# Patient Record
Sex: Female | Born: 1955 | Race: Black or African American | Hispanic: No | Marital: Single | Smoking: Former smoker
Health system: Southern US, Community
[De-identification: ages and names within clinical notes are randomized; demographics above are authoritative.]

## PROBLEM LIST (undated history)

## (undated) DIAGNOSIS — I10 Essential (primary) hypertension: Secondary | ICD-10-CM

## (undated) DIAGNOSIS — I739 Peripheral vascular disease, unspecified: Secondary | ICD-10-CM

## (undated) DIAGNOSIS — I639 Cerebral infarction, unspecified: Secondary | ICD-10-CM

## (undated) DIAGNOSIS — J449 Chronic obstructive pulmonary disease, unspecified: Secondary | ICD-10-CM

## (undated) DIAGNOSIS — F32A Depression, unspecified: Secondary | ICD-10-CM

## (undated) DIAGNOSIS — F329 Major depressive disorder, single episode, unspecified: Secondary | ICD-10-CM

## (undated) HISTORY — PX: CORONARY ANGIOPLASTY WITH STENT PLACEMENT: SHX49

## (undated) HISTORY — PX: APPENDECTOMY: SHX54

## (undated) HISTORY — PX: ABDOMINAL HYSTERECTOMY: SHX81

---
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
## 2016-11-23 DIAGNOSIS — I639 Cerebral infarction, unspecified: Secondary | ICD-10-CM | POA: Insufficient documentation

## 2019-08-21 ENCOUNTER — Other Ambulatory Visit: Payer: Self-pay

## 2019-08-21 DIAGNOSIS — Z20822 Contact with and (suspected) exposure to covid-19: Secondary | ICD-10-CM

## 2019-08-23 LAB — NOVEL CORONAVIRUS, NAA: SARS-CoV-2, NAA: NOT DETECTED

## 2019-08-24 ENCOUNTER — Inpatient Hospital Stay
Admission: EM | Admit: 2019-08-24 | Discharge: 2019-08-28 | DRG: 378 | Disposition: A | Discharge status: Home / Self Care | Payer: 59 | Attending: Family Medicine | Admitting: Family Medicine

## 2019-08-24 ENCOUNTER — Encounter: Payer: Self-pay | Admitting: Emergency Medicine

## 2019-08-24 ENCOUNTER — Emergency Department: Payer: 59

## 2019-08-24 ENCOUNTER — Other Ambulatory Visit: Payer: Self-pay

## 2019-08-24 DIAGNOSIS — Z880 Allergy status to penicillin: Secondary | ICD-10-CM

## 2019-08-24 DIAGNOSIS — K922 Gastrointestinal hemorrhage, unspecified: Secondary | ICD-10-CM | POA: Diagnosis present

## 2019-08-24 DIAGNOSIS — Z20822 Contact with and (suspected) exposure to covid-19: Secondary | ICD-10-CM

## 2019-08-24 DIAGNOSIS — D62 Acute posthemorrhagic anemia: Secondary | ICD-10-CM | POA: Diagnosis present

## 2019-08-24 DIAGNOSIS — K254 Chronic or unspecified gastric ulcer with hemorrhage: Secondary | ICD-10-CM | POA: Diagnosis not present

## 2019-08-24 DIAGNOSIS — D6832 Hemorrhagic disorder due to extrinsic circulating anticoagulants: Secondary | ICD-10-CM

## 2019-08-24 DIAGNOSIS — Z90710 Acquired absence of both cervix and uterus: Secondary | ICD-10-CM

## 2019-08-24 DIAGNOSIS — F329 Major depressive disorder, single episode, unspecified: Secondary | ICD-10-CM | POA: Diagnosis present

## 2019-08-24 DIAGNOSIS — Z79899 Other long term (current) drug therapy: Secondary | ICD-10-CM

## 2019-08-24 DIAGNOSIS — T45515A Adverse effect of anticoagulants, initial encounter: Secondary | ICD-10-CM | POA: Diagnosis present

## 2019-08-24 DIAGNOSIS — J449 Chronic obstructive pulmonary disease, unspecified: Secondary | ICD-10-CM | POA: Diagnosis not present

## 2019-08-24 DIAGNOSIS — K921 Melena: Secondary | ICD-10-CM

## 2019-08-24 DIAGNOSIS — I739 Peripheral vascular disease, unspecified: Secondary | ICD-10-CM | POA: Diagnosis present

## 2019-08-24 DIAGNOSIS — I69954 Hemiplegia and hemiparesis following unspecified cerebrovascular disease affecting left non-dominant side: Secondary | ICD-10-CM

## 2019-08-24 DIAGNOSIS — K319 Disease of stomach and duodenum, unspecified: Secondary | ICD-10-CM | POA: Diagnosis present

## 2019-08-24 DIAGNOSIS — Z20828 Contact with and (suspected) exposure to other viral communicable diseases: Secondary | ICD-10-CM | POA: Diagnosis present

## 2019-08-24 DIAGNOSIS — Z87891 Personal history of nicotine dependence: Secondary | ICD-10-CM

## 2019-08-24 DIAGNOSIS — I10 Essential (primary) hypertension: Secondary | ICD-10-CM | POA: Diagnosis present

## 2019-08-24 DIAGNOSIS — Z7901 Long term (current) use of anticoagulants: Secondary | ICD-10-CM

## 2019-08-24 DIAGNOSIS — Z86718 Personal history of other venous thrombosis and embolism: Secondary | ICD-10-CM

## 2019-08-24 DIAGNOSIS — G8194 Hemiplegia, unspecified affecting left nondominant side: Secondary | ICD-10-CM | POA: Insufficient documentation

## 2019-08-24 DIAGNOSIS — K253 Acute gastric ulcer without hemorrhage or perforation: Secondary | ICD-10-CM

## 2019-08-24 DIAGNOSIS — Z7982 Long term (current) use of aspirin: Secondary | ICD-10-CM

## 2019-08-24 DIAGNOSIS — Z8673 Personal history of transient ischemic attack (TIA), and cerebral infarction without residual deficits: Secondary | ICD-10-CM

## 2019-08-24 DIAGNOSIS — K449 Diaphragmatic hernia without obstruction or gangrene: Secondary | ICD-10-CM | POA: Diagnosis present

## 2019-08-24 DIAGNOSIS — Z955 Presence of coronary angioplasty implant and graft: Secondary | ICD-10-CM

## 2019-08-24 DIAGNOSIS — R791 Abnormal coagulation profile: Secondary | ICD-10-CM | POA: Diagnosis present

## 2019-08-24 DIAGNOSIS — Z7951 Long term (current) use of inhaled steroids: Secondary | ICD-10-CM

## 2019-08-24 DIAGNOSIS — M791 Myalgia, unspecified site: Secondary | ICD-10-CM | POA: Diagnosis present

## 2019-08-24 DIAGNOSIS — K920 Hematemesis: Secondary | ICD-10-CM

## 2019-08-24 DIAGNOSIS — R531 Weakness: Secondary | ICD-10-CM

## 2019-08-24 HISTORY — DX: Cerebral infarction, unspecified: I63.9

## 2019-08-24 HISTORY — DX: Chronic obstructive pulmonary disease, unspecified: J44.9

## 2019-08-24 HISTORY — DX: Peripheral vascular disease, unspecified: I73.9

## 2019-08-24 HISTORY — DX: Essential (primary) hypertension: I10

## 2019-08-24 HISTORY — DX: Depression, unspecified: F32.A

## 2019-08-24 LAB — BASIC METABOLIC PANEL
Anion gap: 8 (ref 5–15)
BUN: 20 mg/dL (ref 8–23)
CO2: 27 mmol/L (ref 22–32)
Calcium: 8.7 mg/dL — ABNORMAL LOW (ref 8.9–10.3)
Chloride: 107 mmol/L (ref 98–111)
Creatinine, Ser: 0.67 mg/dL (ref 0.44–1.00)
GFR calc Af Amer: >60 mL/min (ref >60)
GFR calc non Af Amer: >60 mL/min (ref >60)
Glucose, Bld: 116 mg/dL — ABNORMAL HIGH (ref 70–99)
Potassium: 3.9 mmol/L (ref 3.5–5.1)
Sodium: 142 mmol/L (ref 135–145)

## 2019-08-24 LAB — HEPATIC FUNCTION PANEL
ALT: 14 U/L (ref 0–44)
AST: 15 U/L (ref 15–41)
Albumin: 3.5 g/dL (ref 3.5–5.0)
Alkaline Phosphatase: 104 U/L (ref 38–126)
Bilirubin, Direct: <0.1 mg/dL (ref 0.0–0.2)
Total Bilirubin: 0.5 mg/dL (ref 0.3–1.2)
Total Protein: 7.6 g/dL (ref 6.5–8.1)

## 2019-08-24 LAB — CBC WITH DIFFERENTIAL/PLATELET
Abs Immature Granulocytes: 0.02 10*3/uL (ref 0.00–0.07)
Basophils Absolute: 0 10*3/uL (ref 0.0–0.1)
Basophils Relative: 0 %
Eosinophils Absolute: 0.2 10*3/uL (ref 0.0–0.5)
Eosinophils Relative: 2 %
HCT: 32.2 % — ABNORMAL LOW (ref 36.0–46.0)
Hemoglobin: 10.3 g/dL — ABNORMAL LOW (ref 12.0–15.0)
Immature Granulocytes: 0 %
Lymphocytes Relative: 32 %
Lymphs Abs: 3.3 10*3/uL (ref 0.7–4.0)
MCH: 26.2 pg (ref 26.0–34.0)
MCHC: 32 g/dL (ref 30.0–36.0)
MCV: 81.9 fL (ref 80.0–100.0)
Monocytes Absolute: 0.8 10*3/uL (ref 0.1–1.0)
Monocytes Relative: 8 %
Neutro Abs: 5.8 10*3/uL (ref 1.7–7.7)
Neutrophils Relative %: 58 %
Platelets: 271 10*3/uL (ref 150–400)
RBC: 3.93 MIL/uL (ref 3.87–5.11)
RDW: 15.4 % (ref 11.5–15.5)
WBC: 10 10*3/uL (ref 4.0–10.5)
nRBC: 0 % (ref 0.0–0.2)

## 2019-08-24 LAB — PROTIME-INR
INR: 4.9 (ref 0.8–1.2)
Prothrombin Time: 45.9 seconds — ABNORMAL HIGH (ref 11.4–15.2)

## 2019-08-24 LAB — POC SARS CORONAVIRUS 2 AG: SARS Coronavirus 2 Ag: NEGATIVE

## 2019-08-24 MED ORDER — VITAMIN K1 10 MG/ML IJ SOLN
10.0000 mg | Freq: Once | INTRAVENOUS | Status: AC
  Administered 2019-08-24: 23:00:00 10 mg via INTRAVENOUS
  Filled 2019-08-24 (×2): qty 1

## 2019-08-24 MED ORDER — SODIUM CHLORIDE 0.9 % IV BOLUS
500.0000 mL | Freq: Once | INTRAVENOUS | Status: AC
  Administered 2019-08-24: 18:00:00 500 mL via INTRAVENOUS

## 2019-08-24 MED ORDER — PANTOPRAZOLE SODIUM 40 MG IV SOLR
40.0000 mg | Freq: Once | INTRAVENOUS | Status: AC
  Administered 2019-08-24: 18:00:00 40 mg via INTRAVENOUS
  Filled 2019-08-24: qty 40

## 2019-08-24 MED ORDER — PHYTONADIONE 5 MG PO TABS
2.5000 mg | ORAL_TABLET | Freq: Once | ORAL | Status: DC
  Filled 2019-08-24 (×2): qty 1

## 2019-08-24 NOTE — ED Notes (Signed)
Pharmacy made aware of missing medication by this RN. Awaiting on mediation.

## 2019-08-24 NOTE — ED Notes (Signed)
Pt took night time medication with apple sauce: Atorvastatin 40mg , gabapentin 300mg , melatonin 10mg 

## 2019-08-24 NOTE — H&P (Signed)
History and Physical  Patient Name: Tammy Wagner     ZOX:[id number]    DOB: 10/18/1955    DOA: 08/24/2019 PCP: System, Pcp Not In  Patient coming from: Family's home  Chief Complaint: Melena      HPI: Tammy Wagner is a 63 y.o. 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  Patient was in usual state of health (visiting family from [place]) until yesterday when she "coughed up blood" several times.  Then today, she had a black tarry bowel movement, and noticed blood when wiping after urinating, and so came to ER.  She has not been taking NSAIDs.  She has been eating greens over Thanksgiving and hasn't had INR checked in a while.  In the ER, she was afebrile, HR and BP normal.  RR and SpO2 normal.  Hgb 10.3 g/dL which is stable from baseline (8-10 g/dL per CareEverywhere).  INR 4.9.  Gross melena on exam.  No stools or bleeding in ER.  She was given 10 mg IV vitamin K and protonic 40 mg IV and the hospitliast service were asked to evaluate.  Of note, she has had fever, aches, malasie and has 3 household members with new COVID diagnoses.          ROS: Review of Systems  Constitutional: Positive for chills and malaise/fatigue.  Respiratory: Positive for hemoptysis.   Gastrointestinal: Positive for melena. Negative for abdominal pain, blood in stool, constipation, diarrhea, heartburn, nausea and vomiting.  Genitourinary: Positive for hematuria.  Musculoskeletal: Positive for myalgias.  All other systems reviewed and are negative.         Past Medical History:  Diagnosis Date  . COPD (chronic obstructive pulmonary disease) (HCC)   . Depression   . Hypertension   . Peripheral vascular disease (HCC)   . Stroke [facility](HCC)     Past Surgical History:  Procedure Laterality Date  . ABDOMINAL HYSTERECTOMY    . APPENDECTOMY    . CORONARY ANGIOPLASTY WITH STENT PLACEMENT      Social History: Patient  lives in [place] by herself, is visiting.  The patient walks unassisted3.  Remote former smoker.  Allergies  Allergen Reactions  . Hydrochlorothiazide     Other reaction(s): Unknown  . Hydrocodone Hives  . Latex Itching  . Penicillins Itching    "I sleep over the limit of time"    Family history: Reviewed and no family history of GI bleed.  Prior to Admission medications   Medication Sig Start Date End Date Taking? Authorizing Provider  amLODipine (NORVASC) 10 MG tablet Take 10 mg by mouth daily.   Yes [provider]  atorvastatin (LIPITOR) 40 MG tablet Take 40 mg by mouth daily.   Yes [provider]  gabapentin (NEURONTIN) 300 MG capsule Take 300 mg by mouth 2 (two) times daily.   Yes [provider]  tiotropium (SPIRIVA) 18 MCG inhalation capsule Place 18 mcg into inhaler and inhale daily.   Yes [provider]  warfarin (COUMADIN) 5 MG tablet Take 5 mg by mouth daily.   Yes [provider]       Physical Exam: BP (!) 151/88 (BP Location: Left Arm)   Pulse 75   Temp 98.1 F (36.7 C) (Oral)   Resp 16   Ht 5\' 2"  (1.575 m)   Wt 113.4 kg   SpO2 100%   BMI 45.73 kg/m  General appearance: Well-developed, obese adult  female, alert and in no acute distress.   Eyes: Anicteric, conjunctiva pink, lids and lashes normal. PERRL.    ENT: No nasal deformity, discharge, epistaxis.  Hearing normal. OP moist without lesions.   Neck: No neck masses.  Trachea midline.  No thyromegaly/tenderness. Lymph: No cervical or supraclavicular lymphadenopathy. Skin: Warm and dry.  No jaundice.  No suspicious rashes or lesions. Cardiac: RRR, nl S1-S2, no murmurs appreciated.  Capillary refill is brisk.  JVP not visible.  No LE edema.  Radial pulses 2+ and symmetric. Respiratory: Normal respiratory rate and rhythm.  CTAB without rales or wheezes. Abdomen: Abdomen soft.  No TTP or guarding. No ascites, distension, hepatosplenomegaly.   MSK: No  deformities or effusions of the large joints of the upper or lower extremities bilaterally.  No cyanosis or clubbing. Neuro: Cranial nerves normal.  Sensation intact to light touch. Speech is fluent.  Muscle strength normal.    Psych: Sensorium intact and responding to questions, attention normal.  Behavior appropriate.  Affect normal.  Judgment and insight appear normal.     Labs on Admission:  I have personally reviewed following labs and imaging studies: CBC: Recent Labs  Lab 08/24/19 1431  WBC 10.0  NEUTROABS 5.8  HGB 10.3*  HCT 32.2*  MCV 81.9  PLT 271   Basic Metabolic Panel: Recent Labs  Lab 08/24/19 1431  NA 142  K 3.9  CL 107  CO2 27  GLUCOSE 116*  BUN 20  CREATININE 0.67  CALCIUM 8.7*   GFR: Estimated Creatinine Clearance: 84.6 mL/min (by C-G formula based on SCr of 0.67 mg/dL).  Liver Function Tests: Recent Labs  Lab 08/24/19 1431  AST 15  ALT 14  ALKPHOS 104  BILITOT 0.5  PROT 7.6  ALBUMIN 3.5   Coagulation Profile: Recent Labs  Lab 08/24/19 1431  INR 4.9*     Recent Results (from the past 240 hour(s))  Novel Coronavirus, NAA ([facility])     Status: None   Collection Time: 08/21/19 10:26 AM   Specimen: Nasopharyngeal(NP) swabs in vial transport medium   NASOPHARYNGE  TESTING  Result Value Ref Range Status   SARS-CoV-2, NAA Not Detected Not Detected Final    Comment: This nucleic acid amplification test was developed and its performance characteristics determined by [employer]. Nucleic acid amplification tests include PCR and TMA. This test has not been FDA cleared or approved. This test has been authorized by FDA under an Emergency Use Authorization (EUA). This test is only authorized for the duration of time the declaration that circumstances exist justifying the authorization of the emergency use of in vitro diagnostic tests for detection of SARS-CoV-2 virus and/or diagnosis of COVID-19 infection under section 564(b)(1) of the  Act, 21 U.S.C. 403KVQ-2(V) (1), unless the authorization is terminated or revoked sooner. When diagnostic testing is negative, the possibility of a false negative result should be considered in the context of a patient's recent exposures and the presence of clinical signs and symptoms consistent with COVID-19. An individual without symptoms of COVID-19 and who is not shedding SARS-CoV-2 virus would  expect to have a negative (not detected) result in this assay.                  Assessment/Plan   GI bleed  Acute blood loss anemia Supratherapeutic INR Hgb stable relative to baseline.  Hemodynamically stable.  INR reversing. -Repeat INR in 4 hours -Repeat Hgb in AM -Continue PPI -Hold warfarin -Consult GI, appreciate cares -Clear liquids only  Myalgias  COVID exposures. -Repeat SARS-CoV-2 PCR -Check CXR  COPD  No active spasm -Resume tiotropium when med rec complete  History CVA  Hypertension Peripheral vasculvar disease -Resume antihypertensives, cholesterol medications when med rec complete  Other medications -Continue gabapentin -Continue Abilify when med rec complete    DVT prophylaxis: SCDs  Code Status: FULL  Family Communication:   Disposition Plan: Anticipate IV PPI and GI consultation.  If bleeding stable and no procedure planned, possibly home tomorrow. Consults called: GI, Dr. Allen Norris to see patient Admission status: OBS   At the point of initial evaluation, it is my clinical opinion that admission for OBSERVATION is reasonable and necessary because the patient's presenting complaints in the context of their chronic conditions represent sufficient risk of deterioration or significant morbidity to constitute reasonable grounds for close observation in the hospital setting, but that the patient may be medically stable for discharge from the hospital within 24 to 48 hours.    Medical decision making: Patient seen at 5:57 PM on 08/24/2019.  The  patient was discussed with Dr. Joni Fears.  What exists of the patient's chart was reviewed in depth and summarized above.  Clinical condition: stable.        [facility] Please page though [place] or Epic secure chat:  For password, contact charge nurse

## 2019-08-24 NOTE — ED Notes (Signed)
Pharmacy made aware of missing medication. Awaiting on medication at this time.

## 2019-08-24 NOTE — ED Notes (Signed)
Pt also reports that she is having body aches. Pt states that she is staying with her daughter who tested positive for COVID. Pt was tested and was negative. Pt has been having severe body aches x 3 days.

## 2019-08-24 NOTE — ED Provider Notes (Signed)
[facility] Emergency Department Provider Note  ____________________________________________  Time seen: Approximately 5:05 PM  I have reviewed the triage vital signs and the nursing notes.   HISTORY  Chief Complaint Vaginal Bleeding and Bleeding/Bruising    HPI Tammy Wagner is a 63 y.o. female with a history of hypertension and stroke, currently on Coumadin, who comes the ED complaining of  hemoptysis and blood in her urine that she noticed today.  She is also been having black runny stool for the past several days.  No aggravating or alleviating factors.  Associated generalized weakness and severe body aches for the last 3 days along with chills and nonproductive cough.  She is visiting family, been here for the last 2 weeks.  Within the past few days, 3 of her household members have tested positive for Covid.  She reports having a test which resulted as negative yesterday.     Past Medical History:  Diagnosis Date  . Hypertension   . Stroke [facility])      There are no active problems to display for this patient.    Past Surgical History:  Procedure Laterality Date  . ABDOMINAL HYSTERECTOMY    . APPENDECTOMY    . CORONARY ANGIOPLASTY WITH STENT PLACEMENT       Prior to Admission medications   Medication Sig Start Date End Date Taking? Authorizing Provider  amLODipine (NORVASC) 10 MG tablet Take 10 mg by mouth daily.   Yes [provider]  atorvastatin (LIPITOR) 40 MG tablet Take 40 mg by mouth daily.   Yes [provider]  gabapentin (NEURONTIN) 300 MG capsule Take 300 mg by mouth 2 (two) times daily.   Yes [provider]  tiotropium (SPIRIVA) 18 MCG inhalation capsule Place 18 mcg into inhaler and inhale daily.   Yes [provider]  warfarin (COUMADIN) 5 MG tablet Take 5 mg by mouth daily.   Yes [provider]     Allergies Latex and Penicillins   No family history on file.  Social  History Social History   Tobacco Use  . Smoking status: Never Smoker  . Smokeless tobacco: Never Used  Substance Use Topics  . Alcohol use: Not Currently  . Drug use: Not Currently    Review of Systems  Constitutional:   No fever or chills.  ENT:   No sore throat. No rhinorrhea. Cardiovascular:   No chest pain or syncope. Respiratory:   No dyspnea positive cough. Gastrointestinal:   Negative for abdominal pain or vomiting, positive black diarrhea Musculoskeletal:   Negative for focal pain or swelling All other systems reviewed and are negative except as documented above in ROS and HPI.  ____________________________________________   PHYSICAL EXAM:  VITAL SIGNS: ED Triage Vitals  Enc Vitals Group     BP 08/24/19 1421 (!) 151/88     Pulse Rate 08/24/19 1421 75     Resp 08/24/19 1421 16     Temp 08/24/19 1421 98.1 F (36.7 C)     Temp Source 08/24/19 1421 Oral     SpO2 08/24/19 1421 100 %     Weight 08/24/19 1422 250 lb (113.4 kg)     Height 08/24/19 1422 5\' 2"  (1.575 m)     Head Circumference --      Peak Flow --      Pain Score 08/24/19 1421 10     Pain Loc --      Pain Edu? --      Excl. in [place]? --  Vital signs reviewed, nursing assessments reviewed.   Constitutional:   Alert and oriented. Non-toxic appearance. Eyes:   Conjunctivae are normal. EOMI. PERRL. ENT      Head:   Normocephalic and atraumatic.      Nose:   Wearing a mask.      Mouth/Throat:   Wearing a mask.      Neck:   No meningismus. Full ROM. Hematological/Lymphatic/Immunilogical:   No cervical lymphadenopathy. Cardiovascular:   RRR. Symmetric bilateral radial and DP pulses.  No murmurs. Cap refill less than 2 seconds. Respiratory:   Normal respiratory effort without tachypnea/retractions. Breath sounds are clear and equal bilaterally. No wheezes/rales/rhonchi. Gastrointestinal:   Soft and nontender. Non distended. There is no CVA tenderness.  No rebound, rigidity, or guarding.  Rectal exam  performed with nurse page at bedside, reveals melanotic stool, strongly Hemoccult positive  Musculoskeletal:   Normal range of motion in all extremities. No joint effusions.  No lower extremity tenderness.  No edema. Neurologic:   Normal speech and language.  Motor grossly intact. No acute focal neurologic deficits are appreciated.  Skin:    Skin is warm, dry and intact. No rash noted.  No petechiae, purpura, or bullae.  ____________________________________________    LABS (pertinent positives/negatives) (all labs ordered are listed, but only abnormal results are displayed) Labs Reviewed  PROTIME-INR - Abnormal; Notable for the following components:      Result Value   Prothrombin Time 45.9 (*)    INR 4.9 (*)    All other components within normal limits  CBC WITH DIFFERENTIAL/PLATELET - Abnormal; Notable for the following components:   Hemoglobin 10.3 (*)    HCT 32.2 (*)    All other components within normal limits  BASIC METABOLIC PANEL - Abnormal; Notable for the following components:   Glucose, Bld 116 (*)    Calcium 8.7 (*)    All other components within normal limits  HEPATIC FUNCTION PANEL  POC SARS CORONAVIRUS 2 AG -  ED   ____________________________________________   EKG    ____________________________________________    RADIOLOGY  No results found.  ____________________________________________   PROCEDURES .Critical Care Performed by: Sharman CheekStafford, Angeliyah Kirkey, MD Authorized by: Sharman CheekStafford, Arianne Klinge, MD   Critical care provider statement:    Critical care time (minutes):  35   Critical care time was exclusive of:  Separately billable procedures and treating other patients   Critical care was necessary to treat or prevent imminent or life-threatening deterioration of the following conditions:  Circulatory failure and toxidrome   Critical care was time spent personally by me on the following activities:  Development of treatment plan with patient or surrogate,  discussions with consultants, evaluation of patient's response to treatment, examination of patient, obtaining history from patient or surrogate, ordering and performing treatments and interventions, ordering and review of laboratory studies, ordering and review of radiographic studies, pulse oximetry, re-evaluation of patient's condition and review of old charts    ____________________________________________    CLINICAL IMPRESSION / ASSESSMENT AND PLAN / ED COURSE  Medications ordered in the ED: Medications  phytonadione (VITAMIN K) 10 mg in dextrose 5 % 50 mL IVPB (has no administration in time range)  pantoprazole (PROTONIX) injection 40 mg (has no administration in time range)  sodium chloride 0.9 % bolus 500 mL (has no administration in time range)    Pertinent labs & imaging results that were available during my care of the patient were reviewed by me and considered in my medical decision making (see chart for  details).  Tammy Wagner was evaluated in Emergency Department on 08/24/2019 for the symptoms described in the history of present illness. She was evaluated in the context of the global COVID-19 pandemic, which necessitated consideration that the patient might be at risk for infection with the SARS-CoV-2 virus that causes COVID-19. Institutional protocols and algorithms that pertain to the evaluation of patients at risk for COVID-19 are in a state of rapid change based on information released by regulatory bodies including the CDC and federal and state organizations. These policies and algorithms were followed during the patient's care in the ED.   Patient presents with generalized weakness, cough, body aches concerning for Covid especially with 3 household members who have tested positive recently.  Despite her recent negative test is very likely that she has Covid infection.  Labs show a hemoglobin of 10 which appears to be baseline.  INR is 4.9, and exam reveals melanotic stool  concerning for acute upper GI bleeding in the setting of her supratherapeutic INR.  I will give IV vitamin K, retest for Covid.  She will need to be admitted for further management of the upper GI bleed to ensure that she remains hemodynamically stable.      ____________________________________________   FINAL CLINICAL IMPRESSION(S) / ED DIAGNOSES    Final diagnoses:  Acute upper GI bleed  Warfarin-induced coagulopathy (HCC)  Suspected COVID-19 virus infection  Generalized weakness     ED Discharge Orders    None      Portions of this note were generated with dragon dictation software. Dictation errors may occur despite best attempts at proofreading.   Sharman Cheek, MD 08/24/19 [phone]

## 2019-08-24 NOTE — ED Triage Notes (Signed)
Pt to ED via POV c/o vaginal bleeding and coughing up blood. Pt states that she is on Coumadin and that she has not had her INR checked in over 1 month. Pt reports that she ate a lot of greens recently. Pt states that when she woke up this morning there was blood on her pillow and that she is seeing blood when she is wiping. Pt is in NAD.

## 2019-08-24 NOTE — ED Notes (Signed)
See triage note  States she noticed some bleeding this am  States she is on blood thinners   Noticed blood on her pillow  And she had some vaginal bleeding

## 2019-08-25 DIAGNOSIS — T45515A Adverse effect of anticoagulants, initial encounter: Secondary | ICD-10-CM | POA: Diagnosis present

## 2019-08-25 DIAGNOSIS — M791 Myalgia, unspecified site: Secondary | ICD-10-CM | POA: Diagnosis present

## 2019-08-25 DIAGNOSIS — K920 Hematemesis: Secondary | ICD-10-CM | POA: Diagnosis not present

## 2019-08-25 DIAGNOSIS — J449 Chronic obstructive pulmonary disease, unspecified: Secondary | ICD-10-CM | POA: Diagnosis present

## 2019-08-25 DIAGNOSIS — I739 Peripheral vascular disease, unspecified: Secondary | ICD-10-CM | POA: Diagnosis present

## 2019-08-25 DIAGNOSIS — K254 Chronic or unspecified gastric ulcer with hemorrhage: Secondary | ICD-10-CM | POA: Diagnosis present

## 2019-08-25 DIAGNOSIS — Z87891 Personal history of nicotine dependence: Secondary | ICD-10-CM | POA: Diagnosis not present

## 2019-08-25 DIAGNOSIS — Z79899 Other long term (current) drug therapy: Secondary | ICD-10-CM | POA: Diagnosis not present

## 2019-08-25 DIAGNOSIS — I10 Essential (primary) hypertension: Secondary | ICD-10-CM | POA: Diagnosis present

## 2019-08-25 DIAGNOSIS — Z955 Presence of coronary angioplasty implant and graft: Secondary | ICD-10-CM | POA: Diagnosis not present

## 2019-08-25 DIAGNOSIS — K253 Acute gastric ulcer without hemorrhage or perforation: Secondary | ICD-10-CM | POA: Diagnosis not present

## 2019-08-25 DIAGNOSIS — Z7901 Long term (current) use of anticoagulants: Secondary | ICD-10-CM | POA: Diagnosis not present

## 2019-08-25 DIAGNOSIS — Z90710 Acquired absence of both cervix and uterus: Secondary | ICD-10-CM | POA: Diagnosis not present

## 2019-08-25 DIAGNOSIS — K921 Melena: Secondary | ICD-10-CM | POA: Diagnosis not present

## 2019-08-25 DIAGNOSIS — D62 Acute posthemorrhagic anemia: Secondary | ICD-10-CM

## 2019-08-25 DIAGNOSIS — Z7951 Long term (current) use of inhaled steroids: Secondary | ICD-10-CM | POA: Diagnosis not present

## 2019-08-25 DIAGNOSIS — K922 Gastrointestinal hemorrhage, unspecified: Secondary | ICD-10-CM | POA: Diagnosis present

## 2019-08-25 DIAGNOSIS — F329 Major depressive disorder, single episode, unspecified: Secondary | ICD-10-CM | POA: Diagnosis present

## 2019-08-25 DIAGNOSIS — Z7982 Long term (current) use of aspirin: Secondary | ICD-10-CM | POA: Diagnosis not present

## 2019-08-25 DIAGNOSIS — Z880 Allergy status to penicillin: Secondary | ICD-10-CM | POA: Diagnosis not present

## 2019-08-25 DIAGNOSIS — Z86718 Personal history of other venous thrombosis and embolism: Secondary | ICD-10-CM | POA: Diagnosis not present

## 2019-08-25 DIAGNOSIS — R791 Abnormal coagulation profile: Secondary | ICD-10-CM | POA: Diagnosis present

## 2019-08-25 DIAGNOSIS — K319 Disease of stomach and duodenum, unspecified: Secondary | ICD-10-CM | POA: Diagnosis present

## 2019-08-25 DIAGNOSIS — Z20828 Contact with and (suspected) exposure to other viral communicable diseases: Secondary | ICD-10-CM | POA: Diagnosis present

## 2019-08-25 DIAGNOSIS — I69954 Hemiplegia and hemiparesis following unspecified cerebrovascular disease affecting left non-dominant side: Secondary | ICD-10-CM | POA: Diagnosis not present

## 2019-08-25 DIAGNOSIS — K449 Diaphragmatic hernia without obstruction or gangrene: Secondary | ICD-10-CM | POA: Diagnosis present

## 2019-08-25 LAB — CBC
HCT: 31.3 % — ABNORMAL LOW (ref 36.0–46.0)
Hemoglobin: 9.5 g/dL — ABNORMAL LOW (ref 12.0–15.0)
MCH: 25.5 pg — ABNORMAL LOW (ref 26.0–34.0)
MCHC: 30.4 g/dL (ref 30.0–36.0)
MCV: 83.9 fL (ref 80.0–100.0)
Platelets: 273 10*3/uL (ref 150–400)
RBC: 3.73 MIL/uL — ABNORMAL LOW (ref 3.87–5.11)
RDW: 15.4 % (ref 11.5–15.5)
WBC: 9.7 10*3/uL (ref 4.0–10.5)
nRBC: 0 % (ref 0.0–0.2)

## 2019-08-25 LAB — SARS CORONAVIRUS 2 (TAT 6-24 HRS): SARS Coronavirus 2: NEGATIVE

## 2019-08-25 LAB — BASIC METABOLIC PANEL
Anion gap: 9 (ref 5–15)
BUN: 16 mg/dL (ref 8–23)
CO2: 24 mmol/L (ref 22–32)
Calcium: 8.5 mg/dL — ABNORMAL LOW (ref 8.9–10.3)
Chloride: 104 mmol/L (ref 98–111)
Creatinine, Ser: 0.77 mg/dL (ref 0.44–1.00)
GFR calc Af Amer: >60 mL/min (ref >60)
GFR calc non Af Amer: >60 mL/min (ref >60)
Glucose, Bld: 98 mg/dL (ref 70–99)
Potassium: 3.8 mmol/L (ref 3.5–5.1)
Sodium: 137 mmol/L (ref 135–145)

## 2019-08-25 LAB — PROTIME-INR
INR: 4.4 (ref 0.8–1.2)
INR: 1.4 — ABNORMAL HIGH (ref 0.8–1.2)
Prothrombin Time: 42.2 seconds — ABNORMAL HIGH (ref 11.4–15.2)
Prothrombin Time: 17.4 seconds — ABNORMAL HIGH (ref 11.4–15.2)

## 2019-08-25 LAB — HIV ANTIBODY (ROUTINE TESTING W REFLEX): HIV Screen 4th Generation wRfx: NONREACTIVE

## 2019-08-25 MED ORDER — GABAPENTIN 300 MG PO CAPS
300.0000 mg | ORAL_CAPSULE | Freq: Two times a day (BID) | ORAL | Status: DC
  Administered 2019-08-25 – 2019-08-28 (×8): 300 mg via ORAL
  Filled 2019-08-25 (×8): qty 1

## 2019-08-25 MED ORDER — ACETAMINOPHEN 325 MG PO TABS
650.0000 mg | ORAL_TABLET | Freq: Four times a day (QID) | ORAL | Status: DC | PRN
  Administered 2019-08-25 – 2019-08-28 (×4): 650 mg via ORAL
  Filled 2019-08-25 (×4): qty 2

## 2019-08-25 MED ORDER — MELATONIN 5 MG PO TABS
10.0000 mg | ORAL_TABLET | Freq: Every day | ORAL | Status: DC
  Administered 2019-08-25 – 2019-08-27 (×3): 10 mg via ORAL
  Filled 2019-08-25 (×4): qty 2

## 2019-08-25 MED ORDER — PANTOPRAZOLE SODIUM 40 MG IV SOLR
40.0000 mg | Freq: Two times a day (BID) | INTRAVENOUS | Status: DC
  Administered 2019-08-25 – 2019-08-28 (×8): 40 mg via INTRAVENOUS
  Filled 2019-08-25 (×8): qty 40

## 2019-08-25 MED ORDER — OMEGA-3-ACID ETHYL ESTERS 1 G PO CAPS
1.0000 g | ORAL_CAPSULE | Freq: Every day | ORAL | Status: DC
  Administered 2019-08-25 – 2019-08-28 (×4): 1 g via ORAL
  Filled 2019-08-25 (×4): qty 1

## 2019-08-25 MED ORDER — ONDANSETRON HCL 4 MG PO TABS: 4.0000 mg | ORAL_TABLET | Freq: Four times a day (QID) | ORAL | Status: DC | PRN

## 2019-08-25 MED ORDER — AMLODIPINE BESYLATE 10 MG PO TABS
10.0000 mg | ORAL_TABLET | Freq: Every day | ORAL | Status: DC
  Administered 2019-08-25 – 2019-08-28 (×4): 10 mg via ORAL
  Filled 2019-08-25 (×4): qty 1

## 2019-08-25 MED ORDER — ACETAMINOPHEN 650 MG RE SUPP: 650.0000 mg | Freq: Four times a day (QID) | RECTAL | Status: DC | PRN

## 2019-08-25 MED ORDER — TIOTROPIUM BROMIDE MONOHYDRATE 18 MCG IN CAPS
18.0000 ug | ORAL_CAPSULE | Freq: Every day | RESPIRATORY_TRACT | Status: DC
  Administered 2019-08-25 – 2019-08-28 (×4): 18 ug via RESPIRATORY_TRACT
  Filled 2019-08-25: qty 5

## 2019-08-25 MED ORDER — ATORVASTATIN CALCIUM 20 MG PO TABS
40.0000 mg | ORAL_TABLET | Freq: Every day | ORAL | Status: DC
  Administered 2019-08-25 – 2019-08-28 (×4): 40 mg via ORAL
  Filled 2019-08-25 (×4): qty 2

## 2019-08-25 MED ORDER — ONDANSETRON HCL 4 MG/2ML IJ SOLN: 4.0000 mg | Freq: Four times a day (QID) | INTRAMUSCULAR | Status: DC | PRN

## 2019-08-25 NOTE — Progress Notes (Signed)
Brief Narrative:  Tammy Wagner is a 63 y.o. 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 Patient was in usual state of health (visiting family from [place]) until yesterday when she "coughed up blood" several times.  Then today, she had a black tarry bowel movement, and noticed blood when wiping after urinating, and so came to ER. She has not been taking NSAIDs.  She has been eating greens over Thanksgiving and hasn't had INR checked in a while. In the ER, she was afebrile, HR and BP normal.  RR and SpO2 normal.  Hgb 10.3 g/dL which is stable from baseline (8-10 g/dL per CareEverywhere).  INR 4.9.  Gross melena on exam.  No stools or bleeding in ER. She was given 10 mg IV vitamin K and protonic 40 mg IV and the hospitliast service were asked to evaluate. Of note, she has had fever, aches, malasie and has 3 household members with new COVID diagnoses.  Subjective: No acute bleeding. No acute issues. CLD now.   Objective: Vital signs in last 24 hours: Temp:  [98 F (36.7 C)-98.8 F (37.1 C)] 98.4 F (36.9 C) (12/05 1221) Pulse Rate:  [66-85] 66 (12/05 1221) Resp:  [16-20] 16 (12/05 1221) BP: (112-151)/(69-88) 124/76 (12/05 1221) SpO2:  [94 %-100 %] 95 % (12/05 1221) Weight:  [106.7 kg-113.4 kg] 106.7 kg (12/05 0132)  Intake/Output from previous day: 12/04 0701 - 12/05 0700 In: 550  Out: -  Intake/Output this shift: No intake/output data recorded.   PEx: def    Results for orders placed or performed during the hospital encounter of 08/24/19 (from the past 24 hour(s))  Protime-INR     Status: Abnormal   Collection Time: 08/24/19  2:31 PM  Result Value Ref Range   Prothrombin Time 45.9 (H) 11.4 - 15.2 seconds   INR 4.9 (HH) 0.8 - 1.2  CBC with Differential     Status: Abnormal   Collection Time: 08/24/19  2:31 PM  Result Value Ref Range   WBC 10.0 4.0 - 10.5 K/uL   RBC 3.93 3.87 - 5.11 MIL/uL    Hemoglobin 10.3 (L) 12.0 - 15.0 g/dL   HCT 16.1 (L) 09.6 - 04.5 %   MCV 81.9 80.0 - 100.0 fL   MCH 26.2 26.0 - 34.0 pg   MCHC 32.0 30.0 - 36.0 g/dL   RDW 40.9 81.1 - 91.4 %   Platelets 271 150 - 400 K/uL   nRBC 0.0 0.0 - 0.2 %   Neutrophils Relative % 58 %   Neutro Abs 5.8 1.7 - 7.7 K/uL   Lymphocytes Relative 32 %   Lymphs Abs 3.3 0.7 - 4.0 K/uL   Monocytes Relative 8 %   Monocytes Absolute 0.8 0.1 - 1.0 K/uL   Eosinophils Relative 2 %   Eosinophils Absolute 0.2 0.0 - 0.5 K/uL   Basophils Relative 0 %   Basophils Absolute 0.0 0.0 - 0.1 K/uL   Immature Granulocytes 0 %   Abs Immature Granulocytes 0.02 0.00 - 0.07 K/uL  Basic metabolic panel     Status: Abnormal   Collection Time: 08/24/19  2:31 PM  Result Value Ref Range   Sodium 142 135 - 145 mmol/L   Potassium 3.9 3.5 - 5.1 mmol/L   Chloride 107 98 - 111 mmol/L   CO2 27 22 - 32 mmol/L   Glucose, Bld 116 (H) 70 - 99 mg/dL   BUN 20 8 -  23 mg/dL   Creatinine, Ser 6.290.67 0.44 - 1.00 mg/dL   Calcium 8.7 (L) 8.9 - 10.3 mg/dL   GFR calc non Af Amer >60 >60 mL/min   GFR calc Af Amer >60 >60 mL/min   Anion gap 8 5 - 15  Hepatic function panel     Status: None   Collection Time: 08/24/19  2:31 PM  Result Value Ref Range   Total Protein 7.6 6.5 - 8.1 g/dL   Albumin 3.5 3.5 - 5.0 g/dL   AST 15 15 - 41 U/L   ALT 14 0 - 44 U/L   Alkaline Phosphatase 104 38 - 126 U/L   Total Bilirubin 0.5 0.3 - 1.2 mg/dL   Bilirubin, Direct <5.2<0.1 0.0 - 0.2 mg/dL   Indirect Bilirubin NOT CALCULATED 0.3 - 0.9 mg/dL  POC SARS Coronavirus 2 Ag     Status: None   Collection Time: 08/24/19  5:34 PM  Result Value Ref Range   SARS Coronavirus 2 Ag NEGATIVE NEGATIVE  Protime-INR     Status: Abnormal   Collection Time: 08/24/19 11:09 PM  Result Value Ref Range   Prothrombin Time 42.2 (H) 11.4 - 15.2 seconds   INR 4.4 (HH) 0.8 - 1.2  Basic metabolic panel     Status: Abnormal   Collection Time: 08/25/19  8:21 AM  Result Value Ref Range   Sodium 137 135  - 145 mmol/L   Potassium 3.8 3.5 - 5.1 mmol/L   Chloride 104 98 - 111 mmol/L   CO2 24 22 - 32 mmol/L   Glucose, Bld 98 70 - 99 mg/dL   BUN 16 8 - 23 mg/dL   Creatinine, Ser 8.410.77 0.44 - 1.00 mg/dL   Calcium 8.5 (L) 8.9 - 10.3 mg/dL   GFR calc non Af Amer >60 >60 mL/min   GFR calc Af Amer >60 >60 mL/min   Anion gap 9 5 - 15  CBC     Status: Abnormal   Collection Time: 08/25/19  8:21 AM  Result Value Ref Range   WBC 9.7 4.0 - 10.5 K/uL   RBC 3.73 (L) 3.87 - 5.11 MIL/uL   Hemoglobin 9.5 (L) 12.0 - 15.0 g/dL   HCT 32.431.3 (L) 40.136.0 - 02.746.0 %   MCV 83.9 80.0 - 100.0 fL   MCH 25.5 (L) 26.0 - 34.0 pg   MCHC 30.4 30.0 - 36.0 g/dL   RDW 25.315.4 66.411.5 - 40.315.5 %   Platelets 273 150 - 400 K/uL   nRBC 0.0 0.0 - 0.2 %  Protime-INR     Status: Abnormal   Collection Time: 08/25/19  8:21 AM  Result Value Ref Range   Prothrombin Time 17.4 (H) 11.4 - 15.2 seconds   INR 1.4 (H) 0.8 - 1.2    Studies/Results: Dg Chest Portable 1 View  Result Date: 08/24/2019 CLINICAL DATA:  Cough short of breath EXAM: PORTABLE CHEST 1 VIEW COMPARISON:  None. FINDINGS: Electronic device over the left upper chest. Cardiomegaly. No focal airspace disease or effusion. No pneumothorax. IMPRESSION: No active disease.  Cardiomegaly Electronically Signed   By: Jasmine PangKim  Fujinaga M.D.   On: 08/24/2019 18:33    Scheduled Meds: . amLODipine  10 mg Oral Daily  . atorvastatin  40 mg Oral Daily  . gabapentin  300 mg Oral BID  . pantoprazole (PROTONIX) IV  40 mg Intravenous Q12H  . tiotropium  18 mcg Inhalation Daily   Continuous Infusions: PRN Meds:acetaminophen **OR** acetaminophen, ondansetron **OR** ondansetron (ZOFRAN) IV  Assessment/Plan:  GI bleed  Acute blood loss anemia Supratherapeutic INR Hgb/Hct 9.5/31.5 -  Hemodynamically stable.  INR is subtherapeutic now  -Repeat Hgb in AM -Continue PPI -Cont to hold warfarin -Consulted GI: Possible EGD on 08/26/19  -Clear liquids only and NPO from midnight tonight   Myalgias   - Improving  - May check other viral panel  Hx of recent COVID exposures  -Repeat SARS-CoV-2 PCR --> Pending  While Ag negative  -Check CXR --WNL   COPD  No active spasm -Resume tiotropium when med rec complete  History CVA  Hypertension Peripheral vasculvar disease - No acute issues  -Resume antihypertensives, cholesterol medications when med rec complete  Other medications -Continue gabapentin -Continue Abilify when med rec complete   DVT prophylaxis: SCDs  Code Status: FULL  Family Communication:   Disposition Plan: Anticipate IV PPI and GI consultation.  If bleeding stable and no procedure planned, possibly home tomorrow. Consults called: GI  Admission status: OBS   LOS: 0 days   Thaila Bottoms Izetta Dakin

## 2019-08-25 NOTE — ED Notes (Signed)
ED TO INPATIENT HANDOFF REPORT  ED Nurse Name and Phone #: Davina PokeSilvia Margery Szostak, RN [phone]  S Name/Age/Gender Tammy Wagner 63 y.o. female Room/Bed: ED43A/ED43A  Code Status   Code Status: Not on file  Home/SNF/Other Home Patient oriented to: self, place, time and situation Is this baseline? Yes   Triage Complete: Triage complete  Chief Complaint Coughing blood,vaginal bleeding   Triage Note Pt to ED via POV c/o vaginal bleeding and coughing up blood. Pt states that she is on Coumadin and that she has not had her INR checked in over 1 month. Pt reports that she ate a lot of greens recently. Pt states that when she woke up this morning there was blood on her pillow and that she is seeing blood when she is wiping. Pt is in NAD.    Allergies Allergies  Allergen Reactions  . Hydrochlorothiazide     Other reaction(s): Unknown  . Hydrocodone Hives  . Latex Itching  . Penicillins Itching    "I sleep over the limit of time"    Level of Care/Admitting Diagnosis ED Disposition    ED Disposition Condition Comment   Admit  Hospital [facility] [[id number]]  Level of Care: Med-Surg [16]  Covid Evaluation: Symptomatic Person Under Investigation (PUI)  Diagnosis: GI bleed [[id number]][[id number]]  Admitting Physician: Alberteen SamDANFORD, CHRISTOPHER P [[id number]]  Attending Physician: Alberteen SamANFORD, CHRISTOPHER P [[id number]]  PT Class (Do Not Modify): Observation [104]  PT Acc Code (Do Not Modify): Observation [10022]       B Medical/Surgery History Past Medical History:  Diagnosis Date  . COPD (chronic obstructive pulmonary disease) (HCC)   . Depression   . Hypertension   . Peripheral vascular disease (HCC)   . Stroke [facility](HCC)    Past Surgical History:  Procedure Laterality Date  . ABDOMINAL HYSTERECTOMY    . APPENDECTOMY    . CORONARY ANGIOPLASTY WITH STENT PLACEMENT       A IV Location/Drains/Wounds Patient Lines/Drains/Airways Status   Active Line/Drains/Airways    Name:    Placement date:   Placement time:   Site:   Days:   Peripheral IV 08/24/19 Right Forearm   08/24/19    2241    Forearm   1          Intake/Output Last 24 hours  Intake/Output Summary (Last 24 hours) at 08/25/2019 0100 Last data filed at 08/24/2019 2302 Gross per 24 hour  Intake 550 ml  Output -  Net 550 ml    Labs/Imaging Results for orders placed or performed during the hospital encounter of 08/24/19 (from the past 48 hour(s))  Protime-INR     Status: Abnormal   Collection Time: 08/24/19  2:31 PM  Result Value Ref Range   Prothrombin Time 45.9 (H) 11.4 - 15.2 seconds   INR 4.9 (HH) 0.8 - 1.2    Comment: CRITICAL RESULT CALLED TO, READ BACK BY AND VERIFIED WITH: JEME RN AT 1523 08/24/19 HASSANR (NOTE) INR goal varies based on device and disease states. Performed at [facility], [address] [place], [place] [address]   CBC with Differential     Status: Abnormal   Collection Time: 08/24/19  2:31 PM  Result Value Ref Range   WBC 10.0 4.0 - 10.5 K/uL   RBC 3.93 3.87 - 5.11 MIL/uL   Hemoglobin 10.3 (L) 12.0 - 15.0 g/dL   HCT 84.632.2 (L) 96.236.0 - 95.246.0 %   MCV 81.9 80.0 - 100.0 fL   MCH 26.2 26.0 - 34.0  pg   MCHC 32.0 30.0 - 36.0 g/dL   RDW 53.6 64.4 - 03.4 %   Platelets 271 150 - 400 K/uL   nRBC 0.0 0.0 - 0.2 %   Neutrophils Relative % 58 %   Neutro Abs 5.8 1.7 - 7.7 K/uL   Lymphocytes Relative 32 %   Lymphs Abs 3.3 0.7 - 4.0 K/uL   Monocytes Relative 8 %   Monocytes Absolute 0.8 0.1 - 1.0 K/uL   Eosinophils Relative 2 %   Eosinophils Absolute 0.2 0.0 - 0.5 K/uL   Basophils Relative 0 %   Basophils Absolute 0.0 0.0 - 0.1 K/uL   Immature Granulocytes 0 %   Abs Immature Granulocytes 0.02 0.00 - 0.07 K/uL    Comment: Performed at [facility], [address] [place], [place] [address]  Basic metabolic panel     Status: Abnormal   Collection Time: 08/24/19  2:31 PM  Result Value Ref Range   Sodium 142 135 - 145 mmol/L   Potassium 3.9 3.5 - 5.1  mmol/L   Chloride 107 98 - 111 mmol/L   CO2 27 22 - 32 mmol/L   Glucose, Bld 116 (H) 70 - 99 mg/dL   BUN 20 8 - 23 mg/dL   Creatinine, Ser 5.63 0.44 - 1.00 mg/dL   Calcium 8.7 (L) 8.9 - 10.3 mg/dL   GFR calc non Af Amer >60 >60 mL/min   GFR calc Af Amer >60 >60 mL/min   Anion gap 8 5 - 15    Comment: Performed at [facility], [address] [place], [place] [address]  Hepatic function panel     Status: None   Collection Time: 08/24/19  2:31 PM  Result Value Ref Range   Total Protein 7.6 6.5 - 8.1 g/dL   Albumin 3.5 3.5 - 5.0 g/dL   AST 15 15 - 41 U/L   ALT 14 0 - 44 U/L   Alkaline Phosphatase 104 38 - 126 U/L   Total Bilirubin 0.5 0.3 - 1.2 mg/dL   Bilirubin, Direct <3.3 0.0 - 0.2 mg/dL   Indirect Bilirubin NOT CALCULATED 0.3 - 0.9 mg/dL    Comment: Performed at [facility], [address] [place], [place] [address]  POC SARS Coronavirus 2 Ag     Status: None   Collection Time: 08/24/19  5:34 PM  Result Value Ref Range   SARS Coronavirus 2 Ag NEGATIVE NEGATIVE    Comment: (NOTE) SARS-CoV-2 antigen NOT DETECTED.  Negative results are presumptive.  Negative results do not preclude SARS-CoV-2 infection and should not be used as the sole basis for treatment or other patient management decisions, including infection  control decisions, particularly in the presence of clinical signs and  symptoms consistent with COVID-19, or in those who have been in contact with the virus.  Negative results must be combined with clinical observations, patient history, and epidemiological information. The expected result is Negative. Fact Sheet for Patients: https://sanders-williams.net/ Fact Sheet for Healthcare Providers: https://martinez.com/ This test is not yet approved or cleared by the [place] FDA and  has been authorized for detection and/or diagnosis of SARS-CoV-2 by FDA under an Emergency Use Authorization (EUA).  This EUA  will remain in effect (meaning this test can be used) for the duration of  the COVID-19 de claration under Section 564(b)(1) of the Act, 21 U.S.C. section 360bbb-3(b)(1), unless the authorization is terminated or revoked sooner.   Protime-INR     Status: Abnormal   Collection  Time: 08/24/19 11:09 PM  Result Value Ref Range   Prothrombin Time 42.2 (H) 11.4 - 15.2 seconds   INR 4.4 (HH) 0.8 - 1.2    Comment: CRITICAL RESULT CALLED TO, READ BACK BY AND VERIFIED WITH: Jeidy Hoerner URLES @[email or web address]  08/25/2019 TTG (NOTE) INR goal varies based on device and disease states. Performed at [facility], [address] [place], [place] [address]    Dg Chest Portable 1 View  Result Date: 08/24/2019 CLINICAL DATA:  Cough short of breath EXAM: PORTABLE CHEST 1 VIEW COMPARISON:  None. FINDINGS: Electronic device over the left upper chest. Cardiomegaly. No focal airspace disease or effusion. No pneumothorax. IMPRESSION: No active disease.  Cardiomegaly Electronically Signed   By: Donavan Foil M.D.   On: 08/24/2019 18:33    Pending Labs [employer] (From admission, onward)    Start     Ordered   Signed and Held  HIV Antibody (routine testing w rflx)  (HIV Antibody (Routine testing w reflex) panel)  Tomorrow morning,   R     Signed and Held   Signed and Held  Basic metabolic panel  Tomorrow morning,   R     Signed and Held   Signed and Held  CBC  Tomorrow morning,   R     Signed and Held   Signed and Held  Protime-INR  Tomorrow morning,   R     Signed and Held          Vitals/Pain Today's Vitals   08/24/19 1421 08/24/19 1422 08/25/19 0043  BP: (!) 151/88  119/69  Pulse: 75  85  Resp: 16  16  Temp: 98.1 F (36.7 C)  98.8 F (37.1 C)  TempSrc: Oral  Oral  SpO2: 100%  100%  Weight:  113.4 kg   Height:  5\' 2"  (1.575 m)   PainSc: 10-Worst pain ever  5     Isolation Precautions Airborne and Contact precautions  Medications Medications  phytonadione (VITAMIN K) 10 mg in  dextrose 5 % 50 mL IVPB (0 mg Intravenous Stopped 08/24/19 2302)  pantoprazole (PROTONIX) injection 40 mg (40 mg Intravenous Given 08/24/19 1802)  sodium chloride 0.9 % bolus 500 mL (0 mLs Intravenous Stopped 08/24/19 2100)    Mobility walks with device Low fall risk   Focused Assessments Cardiac Assessment Handoff:    No results found for: CKTOTAL, CKMB, CKMBINDEX, TROPONINI No results found for: DDIMER Does the Patient currently have chest pain? No      R Recommendations: See Admitting Provider Note  Report given to:   Additional Notes:

## 2019-08-25 NOTE — ED Notes (Signed)
Pt has her home medicines at bedside Rn has instructed pt not to take medications with out first checking with RN

## 2019-08-25 NOTE — ED Notes (Signed)
Pt ambulatory to restroom using a cane

## 2019-08-25 NOTE — ED Notes (Signed)
Date and time results received: 08/25/19 0025 (use smartphrase ".now" to insert current time)  Test: INR  Critical Value: 4.41 Name of Provider Notified: Dr. Karma Greaser  Orders Received? Or Actions Taken?: Acknowledged pt being admitted

## 2019-08-25 NOTE — Consult Note (Signed)
Lucilla Lame, MD [facility]  [address], [place], [place] [address] Phone: [phone] Fax : [phone]  Consultation  Referring Provider:     Dr. Loleta Books Primary Care Physician:  System, Pcp Not In Primary Gastroenterologist: Althia Forts         Reason for Consultation:     Melena and hematemesis  Date of Admission:  08/24/2019 Date of Consultation:  08/25/2019         HPI:   Tammy Wagner is a 63 y.o. female who was admitted with 1 day of melena.  The patient states she had that yesterday after having some hematemesis on Thursday.  The patient has not been taking any NSAIDs and reports that she has never had any hematemesis in the past.  The patient is on warfarin for a history of CVAs.  She also reports some epigastric pain.  The patient reports that she has been tested twice in the last few days and it has been negative.  The patient is now in isolation is because she has 3 household members that have all tested positive for COVID-19.  There is no further report of any black stools or bloody stools.  The patient also denies any further hematemesis.  The patient's blood work since yesterday showed:  Component     Latest Ref Rng & Units 08/24/2019 08/25/2019  Hemoglobin     12.0 - 15.0 g/dL 10.3 (L) 9.5 (L)  HCT     36.0 - 46.0 % 32.2 (L) 31.3 (L)    There is no prior history of any peptic ulcer disease.  Does not appear that the patient has seen a gastroenterologist since 1998.  The patient was given vitamin K and Protonix with a INR that went from 4.9 admission down to 1.4 today.  Past Medical History:  Diagnosis Date  . COPD (chronic obstructive pulmonary disease) ([place])   . Depression   . Hypertension   . Peripheral vascular disease ([place])   . Stroke [facility])     Past Surgical History:  Procedure Laterality Date  . ABDOMINAL HYSTERECTOMY    . APPENDECTOMY    . CORONARY ANGIOPLASTY WITH STENT PLACEMENT      Prior to Admission medications   Medication Sig Start Date  End Date Taking? Authorizing Provider  amLODipine (NORVASC) 10 MG tablet Take 10 mg by mouth daily.   Yes [provider]  aspirin 325 MG tablet Take 325 mg by mouth daily.   Yes [provider]  atorvastatin (LIPITOR) 40 MG tablet Take 40 mg by mouth daily.   Yes [provider]  gabapentin (NEURONTIN) 300 MG capsule Take 300 mg by mouth 2 (two) times daily.   Yes [provider]  omega-3 acid ethyl esters (LOVAZA) 1 g capsule Take 1 g by mouth daily.   Yes [provider]  tiotropium (SPIRIVA) 18 MCG inhalation capsule Place 18 mcg into inhaler and inhale daily.   Yes [provider]  warfarin (COUMADIN) 5 MG tablet Take 5 mg by mouth daily.    [provider]    Family History  Problem Relation Age of Onset  . Diabetes Neg Hx   . Cancer Neg Hx   . GI Bleed Neg Hx   . Heart attack Neg Hx      Social History   Tobacco Use  . Smoking status: Former Research scientist (life sciences)  . Smokeless tobacco: Never Used  Substance Use Topics  . Alcohol use: Not Currently  . Drug use:  Not Currently    Allergies as of 08/24/2019 - Review Complete 08/24/2019  Allergen Reaction Noted  . Hydrochlorothiazide  01/04/2017  . Hydrocodone Hives 11/19/2016  . Latex Itching 08/24/2019  . Penicillins Itching 08/24/2019    Review of Systems:    All systems reviewed and negative except where noted in HPI.   Physical Exam:  Vital signs in last 24 hours: Temp:  [98 F (36.7 C)-98.8 F (37.1 C)] 98.4 F (36.9 C) (12/05 0555) Pulse Rate:  [75-85] 82 (12/05 0555) Resp:  [16-20] 20 (12/05 0142) BP: (112-151)/(69-88) 112/73 (12/05 0935) SpO2:  [94 %-100 %] 96 % (12/05 0555) Weight:  [106.7 kg-113.4 kg] 106.7 kg (12/05 0132) Last BM Date: 08/25/19 General:   Pleasant, cooperative in NAD Head:  Normocephalic and atraumatic. Eyes:   No icterus.   Conjunctiva pink. PERRLA. Ears:  Normal auditory acuity. Neck:  Supple; no masses or thyroidomegaly Rectal:   Not performed. Msk:  Symmetrical without gross deformities.   Extremities:  Without edema, cyanosis or clubbing. Neurologic:  Alert and oriented x3;  grossly normal neurologically. Skin:  Intact without significant lesions or rashes. Psych:  Alert and cooperative. Normal affect.  LAB RESULTS: Recent Labs    08/24/19 1431 08/25/19 0821  WBC 10.0 9.7  HGB 10.3* 9.5*  HCT 32.2* 31.3*  PLT 271 273   BMET Recent Labs    08/24/19 1431 08/25/19 0821  NA 142 137  K 3.9 3.8  CL 107 104  CO2 27 24  GLUCOSE 116* 98  BUN 20 16  CREATININE 0.67 0.77  CALCIUM 8.7* 8.5*   LFT Recent Labs    08/24/19 1431  PROT 7.6  ALBUMIN 3.5  AST 15  ALT 14  ALKPHOS 104  BILITOT 0.5  BILIDIR <0.1  IBILI NOT CALCULATED   PT/INR Recent Labs    08/24/19 2309 08/25/19 0821  LABPROT 42.2* 17.4*  INR 4.4* 1.4*    STUDIES: Dg Chest Portable 1 View  Result Date: 08/24/2019 CLINICAL DATA:  Cough short of breath EXAM: PORTABLE CHEST 1 VIEW COMPARISON:  None. FINDINGS: Electronic device over the left upper chest. Cardiomegaly. No focal airspace disease or effusion. No pneumothorax. IMPRESSION: No active disease.  Cardiomegaly Electronically Signed   By: Jasmine Pang M.D.   On: 08/24/2019 18:33      Impression / Plan:   Assessment: Principal Problem:   GI bleed Active Problems:   COPD (chronic obstructive pulmonary disease) (HCC)   History of stroke   Essential hypertension   Obesity, Class III, BMI 40-49.9 (morbid obesity) (HCC)   PVD (peripheral vascular disease) (HCC)   Acute blood loss anemia   Tammy Wagner is a 63 y.o. y/o female with who was admitted with a history of melena a elevated INR on Coumadin and hematemesis.  The patient's increased INR has been corrected and it was 1.4 today.  The patient has been put on a clear liquid diet.  The patient will be set up for a upper endoscopy for tomorrow to rule out any peptic ulcer disease prior to starting her back up on her  anticoagulation.  The patient has been explained the plan and agrees with it.   Thank you for involving me in the care of this patient.      LOS: 0 days   Midge Minium, MD  08/25/2019, 11:41 AM Pager [phone] 7am-5pm  Check AMION for 5pm -7am coverage and on weekends   Note: This dictation was prepared with Dragon dictation along with smaller  Company secretary. Any transcriptional errors that result from this process are unintentional.

## 2019-08-26 ENCOUNTER — Inpatient Hospital Stay: Payer: 59 | Admitting: Certified Registered"

## 2019-08-26 ENCOUNTER — Encounter
Admission: EM | Disposition: A | Discharge status: Home / Self Care | Payer: Self-pay | Source: Home / Self Care | Attending: Family Medicine

## 2019-08-26 DIAGNOSIS — K253 Acute gastric ulcer without hemorrhage or perforation: Secondary | ICD-10-CM

## 2019-08-26 DIAGNOSIS — K920 Hematemesis: Secondary | ICD-10-CM | POA: Diagnosis not present

## 2019-08-26 DIAGNOSIS — K921 Melena: Secondary | ICD-10-CM | POA: Diagnosis not present

## 2019-08-26 HISTORY — PX: ESOPHAGOGASTRODUODENOSCOPY (EGD) WITH PROPOFOL: SHX5813

## 2019-08-26 LAB — HEMOGLOBIN AND HEMATOCRIT, BLOOD
HCT: 30.5 % — ABNORMAL LOW (ref 36.0–46.0)
Hemoglobin: 9.9 g/dL — ABNORMAL LOW (ref 12.0–15.0)

## 2019-08-26 LAB — APTT: aPTT: 31 seconds (ref 24–36)

## 2019-08-26 LAB — PROTIME-INR
INR: 1.2 (ref 0.8–1.2)
Prothrombin Time: 14.8 seconds (ref 11.4–15.2)

## 2019-08-26 LAB — HEPARIN LEVEL (UNFRACTIONATED): Heparin Unfractionated: 0.17 IU/mL — ABNORMAL LOW (ref 0.30–0.70)

## 2019-08-26 SURGERY — ESOPHAGOGASTRODUODENOSCOPY (EGD) WITH PROPOFOL
Anesthesia: General

## 2019-08-26 MED ORDER — FENTANYL CITRATE (PF) 100 MCG/2ML IJ SOLN
INTRAMUSCULAR | Status: AC
  Filled 2019-08-26: qty 2

## 2019-08-26 MED ORDER — PROPOFOL 500 MG/50ML IV EMUL
INTRAVENOUS | Status: DC | PRN
  Administered 2019-08-26: 175 ug/kg/min via INTRAVENOUS

## 2019-08-26 MED ORDER — WARFARIN - PHARMACIST DOSING INPATIENT: Freq: Every day | Status: DC

## 2019-08-26 MED ORDER — PROPOFOL 10 MG/ML IV BOLUS
INTRAVENOUS | Status: AC
  Filled 2019-08-26: qty 20

## 2019-08-26 MED ORDER — FENTANYL CITRATE (PF) 100 MCG/2ML IJ SOLN
INTRAMUSCULAR | Status: DC | PRN
  Administered 2019-08-26: 25 ug via INTRAVENOUS

## 2019-08-26 MED ORDER — METOPROLOL TARTRATE 25 MG PO TABS
12.5000 mg | ORAL_TABLET | Freq: Two times a day (BID) | ORAL | Status: DC
  Administered 2019-08-26 – 2019-08-28 (×5): 12.5 mg via ORAL
  Filled 2019-08-26 (×5): qty 1

## 2019-08-26 MED ORDER — HEPARIN (PORCINE) 25000 UT/250ML-% IV SOLN
1800.0000 [IU]/h | INTRAVENOUS | Status: DC
  Administered 2019-08-26: 15:00:00 1600 [IU]/h via INTRAVENOUS
  Filled 2019-08-26 (×4): qty 250

## 2019-08-26 MED ORDER — SODIUM CHLORIDE 0.9 % IV SOLN
INTRAVENOUS | Status: DC | PRN
  Administered 2019-08-26: 1000 mL
  Administered 2019-08-26: 13:00:00 via INTRAVENOUS

## 2019-08-26 MED ORDER — PROPOFOL 10 MG/ML IV BOLUS
INTRAVENOUS | Status: DC | PRN
  Administered 2019-08-26: 70 mg via INTRAVENOUS

## 2019-08-26 MED ORDER — WARFARIN SODIUM 7.5 MG PO TABS
7.5000 mg | ORAL_TABLET | Freq: Once | ORAL | Status: AC
  Administered 2019-08-26: 17:00:00 7.5 mg via ORAL
  Filled 2019-08-26: qty 1

## 2019-08-26 NOTE — Anesthesia Preprocedure Evaluation (Signed)
Anesthesia Evaluation  Patient identified by MRN, date of birth, ID band Patient awake    Reviewed: Allergy & Precautions, NPO status , Patient's Chart, lab work & pertinent test results  History of Anesthesia Complications Negative for: history of anesthetic complications  Airway Mallampati: II       Dental  (+) Poor Dentition, Chipped, Missing   Pulmonary sleep apnea (not CPAP) , COPD,  COPD inhaler, former smoker,           Cardiovascular hypertension, Pt. on medications (-) Past MI and (-) CHF (-) dysrhythmias (-) Valvular Problems/Murmurs     Neuro/Psych Depression CVA (L sided numbness, reolved speech difficulties ), Residual Symptoms    GI/Hepatic Neg liver ROS, neg GERD  ,  Endo/Other  neg diabetes  Renal/GU negative Renal ROS     Musculoskeletal   Abdominal   Peds  Hematology  (+) anemia ,   Anesthesia Other Findings   Reproductive/Obstetrics                             Anesthesia Physical Anesthesia Plan  ASA: III  Anesthesia Plan: General   Post-op Pain Management:    Induction: Intravenous  PONV Risk Score and Plan: 3 and Propofol infusion, TIVA and Treatment may vary due to age or medical condition  Airway Management Planned:   Additional Equipment:   Intra-op Plan:   Post-operative Plan:   Informed Consent: I have reviewed the patients History and Physical, chart, labs and discussed the procedure including the risks, benefits and alternatives for the proposed anesthesia with the patient or authorized representative who has indicated his/her understanding and acceptance.       Plan Discussed with:   Anesthesia Plan Comments:         Anesthesia Quick Evaluation

## 2019-08-26 NOTE — Consult Note (Signed)
[place] for Warfarin and Heparin Indication: Hx of DVT  Allergies  Allergen Reactions  . Hydrochlorothiazide     Other reaction(s): Unknown  . Hydrocodone Hives  . Latex Itching  . Penicillins Itching    "I sleep over the limit of time"    Patient Measurements: Height: 5\' 2"  (157.5 cm) Weight: 235 lb 5 oz (106.7 kg) IBW/kg (Calculated) : 50.1 Heparin Dosing Weight: 75.9 kg  Vital Signs: Temp: 98.6 F (37 C) (12/06 2009) Temp Source: Oral (12/06 2009) BP: 119/72 (12/06 2009) Pulse Rate: 74 (12/06 2009)  Labs: Recent Labs    08/24/19 1431 08/24/19 2309 08/25/19 0821 08/26/19 0834 08/26/19 1418 08/26/19 2119  HGB 10.3*  --  9.5* 9.9*  --   --   HCT 32.2*  --  31.3* 30.5*  --   --   PLT 271  --  273  --   --   --   APTT  --   --   --   --  31  --   LABPROT 45.9* 42.2* 17.4* 14.8  --   --   INR 4.9* 4.4* 1.4* 1.2  --   --   HEPARINUNFRC  --   --   --   --   --  0.17*  CREATININE 0.67  --  0.77  --   --   --     Estimated Creatinine Clearance: 81.5 mL/min (by C-G formula based on SCr of 0.77 mg/dL).   Medical History: Past Medical History:  Diagnosis Date  . COPD (chronic obstructive pulmonary disease) ([place])   . Depression   . Hypertension   . Peripheral vascular disease ([place])   . Stroke [facility])     Medications:  Medications Prior to Admission  Medication Sig Dispense Refill Last Dose  . amLODipine (NORVASC) 10 MG tablet Take 10 mg by mouth daily.   08/24/2019 at 0800  . aspirin 325 MG tablet Take 325 mg by mouth daily.   08/24/2019 at 0800  . atorvastatin (LIPITOR) 40 MG tablet Take 40 mg by mouth daily.   08/23/2019 at 2000  . gabapentin (NEURONTIN) 300 MG capsule Take 300 mg by mouth 2 (two) times daily.   08/24/2019 at 0800  . omega-3 acid ethyl esters (LOVAZA) 1 g capsule Take 1 g by mouth daily.   08/24/2019 at 0800  . tiotropium (SPIRIVA) 18 MCG inhalation capsule Place 18 mcg into inhaler and inhale daily.    08/24/2019 at 0800  . warfarin (COUMADIN) 5 MG tablet Take 5 mg by mouth daily.   Not Taking at Unknown time   Scheduled:  . amLODipine  10 mg Oral Daily  . atorvastatin  40 mg Oral Daily  . gabapentin  300 mg Oral BID  . Melatonin  10 mg Oral QHS  . metoprolol tartrate  12.5 mg Oral BID  . omega-3 acid ethyl esters  1 g Oral Daily  . pantoprazole (PROTONIX) IV  40 mg Intravenous Q12H  . tiotropium  18 mcg Inhalation Daily  . Warfarin - Pharmacist Dosing Inpatient   Does not apply q1800   Infusions:  . heparin Stopped (08/26/19 1636)   PRN: acetaminophen **OR** acetaminophen, ondansetron **OR** ondansetron (ZOFRAN) IV Anti-infectives (From admission, onward)   None      Assessment: Pharmacy consulted to start heparin and warfarin. Home dose warfarin 5 mg daily. CBC stable. No bleeding noted. Gi was consulted. Pt was received vitamin K 10 mg on 12/4.  Date INR Warfarin Dose  12/5 1.2 7.5 mg         Goal of Therapy:  INR 2-3 Heparin level 0.3-0.7 units/ml Monitor platelets by anticoagulation protocol: Yes   Plan:  Heparin  12/6 @ 2119 HL: 0.17. Level is subtherapeutic.  Will increase infusion rate to 1800 units/hr Recheck anti-Xa level in 6 hours and daily while on heparin Continue to monitor H&H and platelets D/c heparin once INR is > 2.   Warfarin INR is subtherapeutic. Will give a higher dose of warfarin tonight (7.5 mg) to help overcome the vitamin K effects. Plan to reduce dose to home dose tomorrow (5 mg). Daily INR ordered.   Gardner Candle, PharmD, BCPS 08/26/2019,10:10 PM

## 2019-08-26 NOTE — Progress Notes (Signed)
Patient transported to endo via bed

## 2019-08-26 NOTE — Consult Note (Addendum)
ANTICOAGULATION CONSULT NOTE   Pharmacy Consult for Warfarin and Heparin Indication: Hx of DVT  Allergies  Allergen Reactions  . Hydrochlorothiazide     Other reaction(s): Unknown  . Hydrocodone Hives  . Latex Itching  . Penicillins Itching    "I sleep over the limit of time"    Patient Measurements: Height: 5\' 2"  (157.5 cm) Weight: 235 lb 5 oz (106.7 kg) IBW/kg (Calculated) : 50.1 Heparin Dosing Weight: 75.9 kg  Vital Signs: Temp: 97.5 F (36.4 C) (12/06 1335) Temp Source: Temporal (12/06 1335) BP: 124/69 (12/06 1345) Pulse Rate: 67 (12/06 1345)  Labs: Recent Labs    08/24/19 1431 08/24/19 2309 08/25/19 0821 08/26/19 0834  HGB 10.3*  --  9.5* 9.9*  HCT 32.2*  --  31.3* 30.5*  PLT 271  --  273  --   LABPROT 45.9* 42.2* 17.4* 14.8  INR 4.9* 4.4* 1.4* 1.2  CREATININE 0.67  --  0.77  --     Estimated Creatinine Clearance: 81.5 mL/min (by C-G formula based on SCr of 0.77 mg/dL).   Medical History: Past Medical History:  Diagnosis Date  . COPD (chronic obstructive pulmonary disease) (HCC)   . Depression   . Hypertension   . Peripheral vascular disease (HCC)   . Stroke [facility])     Medications:  Medications Prior to Admission  Medication Sig Dispense Refill Last Dose  . amLODipine (NORVASC) 10 MG tablet Take 10 mg by mouth daily.   08/24/2019 at 0800  . aspirin 325 MG tablet Take 325 mg by mouth daily.   08/24/2019 at 0800  . atorvastatin (LIPITOR) 40 MG tablet Take 40 mg by mouth daily.   08/23/2019 at 2000  . gabapentin (NEURONTIN) 300 MG capsule Take 300 mg by mouth 2 (two) times daily.   08/24/2019 at 0800  . omega-3 acid ethyl esters (LOVAZA) 1 g capsule Take 1 g by mouth daily.   08/24/2019 at 0800  . tiotropium (SPIRIVA) 18 MCG inhalation capsule Place 18 mcg into inhaler and inhale daily.   08/24/2019 at 0800  . warfarin (COUMADIN) 5 MG tablet Take 5 mg by mouth daily.   Not Taking at Unknown time   Scheduled:  . [MAR Hold] amLODipine  10 mg Oral Daily  .  [MAR Hold] atorvastatin  40 mg Oral Daily  . [MAR Hold] gabapentin  300 mg Oral BID  . [MAR Hold] Melatonin  10 mg Oral QHS  . [MAR Hold] metoprolol tartrate  12.5 mg Oral BID  . [MAR Hold] omega-3 acid ethyl esters  1 g Oral Daily  . [MAR Hold] pantoprazole (PROTONIX) IV  40 mg Intravenous Q12H  . [MAR Hold] tiotropium  18 mcg Inhalation Daily   Infusions:   PRN: [MAR Hold] acetaminophen **OR** [MAR Hold] acetaminophen, [MAR Hold] ondansetron **OR** [MAR Hold] ondansetron (ZOFRAN) IV Anti-infectives (From admission, onward)   None      Assessment: Pharmacy consulted to start heparin and warfarin. Home dose warfarin 5 mg daily. CBC stable. No bleeding noted. Gi was consulted. Pt was received vitamin K 10 mg on 12/4.   Date INR Warfarin Dose  12/5 1.2 7.5 mg         Goal of Therapy:  INR 2-3 Heparin level 0.3-0.7 units/ml Monitor platelets by anticoagulation protocol: Yes   Plan:  Heparin  No bolus Start heparin infusion at 1600 units/hr Check anti-Xa level in 6 hours and daily while on heparin Continue to monitor H&H and platelets D/c heparin once INR is >  2.   Warfarin INR is subtherapeutic. Will give a higher dose of warfarin tonight (7.5 mg) to help overcome the vitamin K effects. Plan to reduce dose to home dose tomorrow (5 mg). Daily INR ordered.   Oswald Hillock, PharmD, BCPS 08/26/2019,1:55 PM

## 2019-08-26 NOTE — Op Note (Signed)
[facility] Gastroenterology Patient Name: Tammy MuskratDeborah Kuss Procedure Date: 08/26/2019 11:52 AM MRN: [id number] Account #: [id number] Date of Birth: 02/02/1956 Admit Type: Inpatient Age: [age] [facility] END[facility] ROOM 4 Gender: Female Note Status: Finalized Procedure:             Upper GI endoscopy Indications:           Hematemesis Providers:             Midge Miniumarren Tannis Burstein MD, MD Medicines:             Propofol per Anesthesia Complications:         No immediate complications. Procedure:             Pre-Anesthesia Assessment:                        - Prior to the procedure, a History and Physical was                         performed, and patient medications and allergies were                         reviewed. The patient's tolerance of previous                         anesthesia was also reviewed. The risks and benefits                         of the procedure and the sedation options and risks                         were discussed with the patient. All questions were                         answered, and informed consent was obtained. Prior                         Anticoagulants: The patient has taken no previous                         anticoagulant or antiplatelet agents. ASA Grade                         Assessment: II - A patient with mild systemic disease.                         After reviewing the risks and benefits, the patient                         was deemed in satisfactory condition to undergo the                         procedure.                        After obtaining informed consent, the endoscope was                         passed under direct vision. Throughout the procedure,  the patient's blood pressure, pulse, and oxygen                         saturations were monitored continuously. The Endoscope                         was introduced through the mouth, and advanced to the                         second part of duodenum. The upper  GI endoscopy was                         accomplished without difficulty. The patient tolerated                         the procedure well. Findings:      A small hiatal hernia was present.      A single localized 5 mm erosion with no bleeding and no stigmata of       recent bleeding was found in the gastric antrum.      The examined duodenum was normal. Impression:            - Small hiatal hernia.                        - Erosive gastropathy with no bleeding and no stigmata                         of recent bleeding.                        - Normal examined duodenum.                        - No specimens collected. Recommendation:        - Return patient to hospital ward for ongoing care.                        - Resume regular diet.                        - Continue present medications. Procedure Code(s):     --- Professional ---                        [phone], Esophagogastroduodenoscopy, flexible,                         transoral; diagnostic, including collection of                         specimen(s) by brushing or washing, when performed                         (separate procedure) Diagnosis Code(s):     --- Professional ---                        K92.0, Hematemesis                        K31.89, Other diseases of stomach and duodenum  CPT copyright 2019 [employer] Association. All rights reserved. The codes documented in this report are preliminary and upon coder review may  be revised to meet current compliance requirements. Lucilla Lame MD, MD 08/26/2019 1:34:37 PM This report has been signed electronically. Number of Addenda: 0 Note Initiated On: 08/26/2019 11:52 AM Estimated Blood Loss:  Estimated blood loss: none.      [facility]

## 2019-08-26 NOTE — Progress Notes (Addendum)
Brief Narrative:  Tammy Wagner is a 63 y.o. 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 Patient was in usual state of health (visiting family from [place]) until yesterday when she "coughed up blood" several times.  Then today, she had a black tarry bowel movement, and noticed blood when wiping after urinating, and so came to ER. She has not been taking NSAIDs.  She has been eating greens over Thanksgiving and hasn't had INR checked in a while. In the ER, she was afebrile, HR and BP normal.  RR and SpO2 normal.  Hgb 10.3 g/dL which is stable from baseline (8-10 g/dL per CareEverywhere).  INR 4.9.  Gross melena on exam.  No stools or bleeding in ER. She was given 10 mg IV vitamin K and protonic 40 mg IV and the hospitliast service were asked to evaluate. Of note, she has had fever, aches, malasie and has 3 household members with new COVID diagnoses.  Subjective: Denies any fever, cough, shortness of breath, palpitation or chest pain this morning.  Awaiting EGD.  N.p.o. now.  Objective: Vital signs in last 24 hours: Temp:  [98.2 F (36.8 C)-98.5 F (36.9 C)] 98.2 F (36.8 C) (12/06 0425) Pulse Rate:  [66-82] 82 (12/06 0425) Resp:  [16-20] 20 (12/05 1952) BP: (124-165)/(76-94) 129/94 (12/06 0425) SpO2:  [94 %-95 %] 94 % (12/06 0425)  Intake/Output from previous day: No intake/output data recorded. Intake/Output this shift: No intake/output data recorded.   PEx:    General appearance: Well-developed, obese adult female, alert and in no acute distress.   Eyes: Anicteric, conjunctiva pink, lids and lashes normal. PERRL.     Neck: No neck masses.  Trachea midline.  No thyromegaly/tenderness. Lymph: No cervical or supraclavicular lymphadenopathy. Skin: Warm and dry.  No jaundice.  No suspicious rashes or lesions. Cardiac: RRR, nl S1-S2, no murmurs appreciated.  Capillary refill is brisk.  JVP not visible.   No LE edema.  Radial pulses 2+ and symmetric. Respiratory: Normal respiratory rate and rhythm.  CTAB without rales or wheezes. Abdomen: Abdomen soft.  No TTP or guarding. No ascites, distension, hepatosplenomegaly.   MSK: No deformities or effusions of the large joints of the upper or lower extremities bilaterally.  No cyanosis or clubbing. Neuro: Cranial nerves normal.  Sensation intact to light touch. Speech is fluent.  Muscle strength normal.    Psych:  attention normal.  Behavior appropriate.  Affect normal.  Judgment and insight appear normal.  Results for orders placed or performed during the hospital encounter of 08/24/19 (from the past 24 hour(s))  Hemoglobin and hematocrit, blood     Status: Abnormal   Collection Time: 08/26/19  8:34 AM  Result Value Ref Range   Hemoglobin 9.9 (L) 12.0 - 15.0 g/dL   HCT 30.5 (L) 36.0 - 46.0 %  Protime-INR     Status: None   Collection Time: 08/26/19  8:34 AM  Result Value Ref Range   Prothrombin Time 14.8 11.4 - 15.2 seconds   INR 1.2 0.8 - 1.2    Studies/Results: Dg Chest Portable 1 View  Result Date: 08/24/2019 CLINICAL DATA:  Cough short of breath EXAM: PORTABLE CHEST 1 VIEW COMPARISON:  None. FINDINGS: Electronic device over the left upper chest. Cardiomegaly. No focal airspace disease or effusion. No pneumothorax. IMPRESSION: No active disease.  Cardiomegaly Electronically Signed   By: Donavan Foil M.D.   On: 08/24/2019 18:33  Scheduled Meds: . amLODipine  10 mg Oral Daily  . atorvastatin  40 mg Oral Daily  . gabapentin  300 mg Oral BID  . Melatonin  10 mg Oral QHS  . metoprolol tartrate  12.5 mg Oral BID  . omega-3 acid ethyl esters  1 g Oral Daily  . pantoprazole (PROTONIX) IV  40 mg Intravenous Q12H  . tiotropium  18 mcg Inhalation Daily   Continuous Infusions: PRN Meds:acetaminophen **OR** acetaminophen, ondansetron **OR** ondansetron (ZOFRAN) IV  Assessment/Plan:  GI bleed  Acute blood loss anemia Supratherapeutic  INR  -No obvious GI bleeding since admission - Hgb remained above 9 following admission -  Hemodynamically stable.  INR is subtherapeutic now Vitamin K intake from the ED -Follow H&H -Continue PPI -Cont to hold warfarin until EGD reports no acute issue -Status post EGD.  That shows some hiatal hernia.  No active site of bleeding could not be a could be identified. -May resume warfarin if cleared by GI; see consulted  Myalgias  -Resolved and no signs of symptoms of upper respiratory tract infection at this point - May check other viral panel  Hx of recent COVID exposures from family members who she lives with -Repeat SARS-CoV-2 PCR --> negative from this admission -Remain on precaution -Check CXR --WNL   COPD  No active spasm -Resumed tiotropium med  History CVA  Hypertension Peripheral vasculvar disease - No acute issues  -Resumed antihypertensives, cholesterol medications when med rec complete  Other medications -Continue gabapentin   DVT prophylaxis: SCDs  Code Status: FULL  Family Communication:   Disposition Plan: Anticipate IV PPI and GI consultation.  If bleeding stable and no procedure planned, possibly home tomorrow. Consults called: GI  Admission status: OBS   LOS: 1 day   Aldahir Litaker Harold Hedge

## 2019-08-26 NOTE — Anesthesia Post-op Follow-up Note (Signed)
Anesthesia QCDR form completed.        

## 2019-08-26 NOTE — Transfer of Care (Signed)
Immediate Anesthesia Transfer of Care Note  Patient: Tammy Wagner  Procedure(s) Performed: ESOPHAGOGASTRODUODENOSCOPY (EGD) WITH PROPOFOL (N/A )  Patient Location: Endoscopy Unit  Anesthesia Type:General  Level of Consciousness: awake, alert  and oriented  Airway & Oxygen Therapy: Patient Spontanous Breathing and Patient connected to face mask oxygen  Post-op Assessment: Report given to RN and Post -op Vital signs reviewed and stable  Post vital signs: Reviewed and stable  Last Vitals:  Vitals Value Taken Time  BP    Temp    Pulse    Resp    SpO2      Last Pain:  Vitals:   08/26/19 1308  TempSrc: Tympanic  PainSc:          Complications: No apparent anesthesia complications

## 2019-08-26 NOTE — Anesthesia Postprocedure Evaluation (Signed)
Anesthesia Post Note  Patient: Tenisha Fleece  Procedure(s) Performed: ESOPHAGOGASTRODUODENOSCOPY (EGD) WITH PROPOFOL (N/A )  Patient location during evaluation: Endoscopy Anesthesia Type: General Level of consciousness: awake and alert Pain management: pain level controlled Vital Signs Assessment: post-procedure vital signs reviewed and stable Respiratory status: spontaneous breathing and respiratory function stable Cardiovascular status: stable Anesthetic complications: no     Last Vitals:  Vitals:   08/26/19 1423 08/26/19 1428  BP: 99/73 129/74  Pulse: 69   Resp: 16   Temp: 36.9 C   SpO2: 93%     Last Pain:  Vitals:   08/26/19 1423  TempSrc: Oral  PainSc:                  KEPHART,WILLIAM K

## 2019-08-27 ENCOUNTER — Encounter: Payer: Self-pay | Admitting: Gastroenterology

## 2019-08-27 DIAGNOSIS — K921 Melena: Secondary | ICD-10-CM | POA: Diagnosis not present

## 2019-08-27 LAB — HEPARIN LEVEL (UNFRACTIONATED)
Heparin Unfractionated: 1.45 IU/mL — ABNORMAL HIGH (ref 0.30–0.70)
Heparin Unfractionated: 1.59 IU/mL — ABNORMAL HIGH (ref 0.30–0.70)
Heparin Unfractionated: 1.05 IU/mL — ABNORMAL HIGH (ref 0.30–0.70)

## 2019-08-27 LAB — CBC
HCT: 29.8 % — ABNORMAL LOW (ref 36.0–46.0)
Hemoglobin: 9.6 g/dL — ABNORMAL LOW (ref 12.0–15.0)
MCH: 26.4 pg (ref 26.0–34.0)
MCHC: 32.2 g/dL (ref 30.0–36.0)
MCV: 82.1 fL (ref 80.0–100.0)
Platelets: 287 10*3/uL (ref 150–400)
RBC: 3.63 MIL/uL — ABNORMAL LOW (ref 3.87–5.11)
RDW: 15.3 % (ref 11.5–15.5)
WBC: 10.8 10*3/uL — ABNORMAL HIGH (ref 4.0–10.5)
nRBC: 0 % (ref 0.0–0.2)

## 2019-08-27 LAB — PROTIME-INR
INR: 1.4 — ABNORMAL HIGH (ref 0.8–1.2)
Prothrombin Time: 17.2 seconds — ABNORMAL HIGH (ref 11.4–15.2)

## 2019-08-27 MED ORDER — HEPARIN (PORCINE) 25000 UT/250ML-% IV SOLN
1500.0000 [IU]/h | INTRAVENOUS | Status: DC
  Administered 2019-08-27: 10:00:00 1500 [IU]/h via INTRAVENOUS

## 2019-08-27 MED ORDER — ACETAMINOPHEN 325 MG PO TABS: 650.0000 mg | ORAL_TABLET | Freq: Four times a day (QID) | ORAL | 0 refills | Status: AC | PRN

## 2019-08-27 MED ORDER — HEPARIN (PORCINE) 25000 UT/250ML-% IV SOLN
1100.0000 [IU]/h | INTRAVENOUS | Status: DC
  Administered 2019-08-28: 1300 [IU]/h via INTRAVENOUS
  Filled 2019-08-27: qty 250

## 2019-08-27 MED ORDER — WARFARIN SODIUM 7.5 MG PO TABS
7.5000 mg | ORAL_TABLET | Freq: Once | ORAL | Status: AC
  Administered 2019-08-27: 18:00:00 7.5 mg via ORAL
  Filled 2019-08-27: qty 1

## 2019-08-27 NOTE — Progress Notes (Addendum)
Brief Narrative:  Tammy Wagner is a 63 y.o. 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 Patient was in usual state of health (visiting family from [place]) until yesterday when she "coughed up blood" several times.  Then today, she had a black tarry bowel movement, and noticed blood when wiping after urinating, and so came to ER. She has not been taking NSAIDs.  She has been eating greens over Thanksgiving and hasn't had INR checked in a while. In the ER, she was afebrile, HR and BP normal.  RR and SpO2 normal.  Hgb 10.3 g/dL which is stable from baseline (8-10 g/dL per CareEverywhere).  INR 4.9.  Gross melena on exam.  No stools or bleeding in ER. She was given 10 mg IV vitamin K and protonic 40 mg IV and the hospitliast service were asked to evaluate. Of note, she has had fever, aches, malasie and has 3 household members with new COVID diagnoses.  Subjective: Intermittent history obtained from nurses and telephone conversation with the patient.  Patient is kept under Covid precaution due to her close family members living with her diagnosed with Covid 19 infection simply.  Denies any chest pain.  Denies any vomiting or hemoptysis or melena or hematemesis.  Objective: Vital signs in last 24 hours: Temp:  [98.3 F (36.8 C)-98.6 F (37 C)] 98.3 F (36.8 C) (12/07 1216) Pulse Rate:  [57-74] 57 (12/07 1216) Resp:  [16-24] 18 (12/07 1216) BP: (99-129)/(68-74) 125/73 (12/07 1216) SpO2:  [93 %-98 %] 96 % (12/07 1216)  Intake/Output from previous day: 12/06 0701 - 12/07 0700 In: 268.6 [P.O.:240; I.V.:28.6] Out: -  Intake/Output this shift: No intake/output data recorded.   PEx from 08/26/2019:   General appearance: Well-developed, obese adult female, alert and in no acute distress.   Eyes: Anicteric, conjunctiva pink, lids and lashes normal. PERRL.     Neck: No neck masses.  Trachea midline.  No  thyromegaly/tenderness. Lymph: No cervical or supraclavicular lymphadenopathy. Skin: Warm and dry.  No jaundice.  No suspicious rashes or lesions. Cardiac: RRR, nl S1-S2, no murmurs appreciated.  Capillary refill is brisk.  JVP not visible.  No LE edema.  Radial pulses 2+ and symmetric. Respiratory: Normal respiratory rate and rhythm.  CTAB without rales or wheezes. Abdomen: Abdomen soft.  No TTP or guarding. No ascites, distension, hepatosplenomegaly.   MSK: No deformities or effusions of the large joints of the upper or lower extremities bilaterally.  No cyanosis or clubbing. Neuro: Cranial nerves normal.  Sensation intact to light touch. Speech is fluent.  Muscle strength normal.    Psych:  attention normal.  Behavior appropriate.  Affect normal.  Judgment and insight appear normal.  Results for orders placed or performed during the hospital encounter of 08/24/19 (from the past 24 hour(s))  Heparin level (unfractionated)     Status: Abnormal   Collection Time: 08/26/19  9:19 PM  Result Value Ref Range   Heparin Unfractionated 0.17 (L) 0.30 - 0.70 IU/mL  Protime-INR     Status: Abnormal   Collection Time: 08/27/19  5:48 AM  Result Value Ref Range   Prothrombin Time 17.2 (H) 11.4 - 15.2 seconds   INR 1.4 (H) 0.8 - 1.2  CBC     Status: Abnormal   Collection Time: 08/27/19  5:48 AM  Result Value Ref Range   WBC 10.8 (H) 4.0 - 10.5 K/uL   RBC 3.63 (L) 3.87 -  5.11 MIL/uL   Hemoglobin 9.6 (L) 12.0 - 15.0 g/dL   HCT 91.429.8 (L) 78.236.0 - 95.646.0 %   MCV 82.1 80.0 - 100.0 fL   MCH 26.4 26.0 - 34.0 pg   MCHC 32.2 30.0 - 36.0 g/dL   RDW 21.315.3 08.611.5 - 57.815.5 %   Platelets 287 150 - 400 K/uL   nRBC 0.0 0.0 - 0.2 %  Heparin level (unfractionated)     Status: Abnormal   Collection Time: 08/27/19  5:48 AM  Result Value Ref Range   Heparin Unfractionated 1.45 (H) 0.30 - 0.70 IU/mL  Heparin level (unfractionated)     Status: Abnormal   Collection Time: 08/27/19  7:41 AM  Result Value Ref Range   Heparin  Unfractionated 1.59 (H) 0.30 - 0.70 IU/mL    Studies/Results: Dg Chest Portable 1 View  Result Date: 08/24/2019 CLINICAL DATA:  Cough short of breath EXAM: PORTABLE CHEST 1 VIEW COMPARISON:  None. FINDINGS: Electronic device over the left upper chest. Cardiomegaly. No focal airspace disease or effusion. No pneumothorax. IMPRESSION: No active disease.  Cardiomegaly Electronically Signed   By: Jasmine PangKim  Fujinaga M.D.   On: 08/24/2019 18:33    Scheduled Meds: . amLODipine  10 mg Oral Daily  . atorvastatin  40 mg Oral Daily  . gabapentin  300 mg Oral BID  . Melatonin  10 mg Oral QHS  . metoprolol tartrate  12.5 mg Oral BID  . omega-3 acid ethyl esters  1 g Oral Daily  . pantoprazole (PROTONIX) IV  40 mg Intravenous Q12H  . tiotropium  18 mcg Inhalation Daily  . warfarin  7.5 mg Oral ONCE-1800  . Warfarin - Pharmacist Dosing Inpatient   Does not apply q1800   Continuous Infusions: . heparin 1,500 Units/hr (08/27/19 0934)   PRN Meds:acetaminophen **OR** acetaminophen, ondansetron **OR** ondansetron (ZOFRAN) IV  Assessment/Plan:  GI bleed  Acute blood loss anemia Supratherapeutic INR --Status post EGD.  That shows some hiatal hernia.  No active site of bleeding could not be a could be identified. - No obvious GI bleeding since admission - Hgb remained above 9 following admission -  Hemodynamically stable.  INR is subtherapeutic now but improving towards goal; that is post Vitamin K intake from the ED -Follow H&H -Continue PPI -Coumadin restarted bridging with heparin per pharmacy  Myalgias  -Resolved and no signs of symptoms of upper respiratory tract infection at this point - May check other viral panel  Hx of recent COVID exposures from family members who she lives with -Repeat SARS-CoV-2 PCR --> negative from this admission -Remain on precaution -Check CXR --WNL   COPD  No active spasm -Resumed tiotropium med  History CVA  Hypertension Peripheral vasculvar disease - No  acute issues  -Resumed antihypertensives, cholesterol medications when med rec complete  Other medications -Continue gabapentin  Covid-19 precautions: Patient's family members who she was living with prior to being admitted here recently diagnosed with COVID-19 infection.  Even though patient tested negative since admission CDC guidelines on hospital guidelines Covid precautions  being implemented. She has no acute issues or complaints -To follow precaution  Disposition: And was supposed to be discharged today home with close follow-up with PCP for INR recheck as she is still subtherapeutic.  However due to patient being in isolation and from another city it was difficult to schedule an appointment with her PCP for the case IT consultantmanager social worker and nurses.  Patient safety and precaution patient is to stay here overnight and recheck her  INR  following morning. -Patient says he is to be quarantined for 14 days.  And patient may not be able to see her PCP.  DVT prophylaxis: SCDs  Code Status: FULL   LOS: 2 days   Korry Dalgleish Izetta Dakin

## 2019-08-27 NOTE — Consult Note (Signed)
[place] for Warfarin and Heparin Indication: Hx of DVT  Allergies  Allergen Reactions  . Hydrochlorothiazide     Other reaction(s): Unknown  . Hydrocodone Hives  . Latex Itching  . Penicillins Itching    "I sleep over the limit of time"    Patient Measurements: Height: 5\' 2"  (157.5 cm) Weight: 235 lb 5 oz (106.7 kg) IBW/kg (Calculated) : 50.1 Heparin Dosing Weight: 75.9 kg  Vital Signs: Temp: 98.3 F (36.8 C) (12/07 1216) Temp Source: Oral (12/07 1216) BP: 125/73 (12/07 1216) Pulse Rate: 57 (12/07 1216)  Labs: Recent Labs    08/25/19 0821 08/26/19 0834 08/26/19 1418  08/27/19 0548 08/27/19 0741 08/27/19 1606  HGB 9.5* 9.9*  --   --  9.6*  --   --   HCT 31.3* 30.5*  --   --  29.8*  --   --   PLT 273  --   --   --  287  --   --   APTT  --   --  31  --   --   --   --   LABPROT 17.4* 14.8  --   --  17.2*  --   --   INR 1.4* 1.2  --   --  1.4*  --   --   HEPARINUNFRC  --   --   --    < > 1.45* 1.59* 1.05*  CREATININE 0.77  --   --   --   --   --   --    < > = values in this interval not displayed.    Estimated Creatinine Clearance: 81.5 mL/min (by C-G formula based on SCr of 0.77 mg/dL).   Medical History: Past Medical History:  Diagnosis Date  . COPD (chronic obstructive pulmonary disease) ([place])   . Depression   . Hypertension   . Peripheral vascular disease ([place])   . Stroke [facility])     Medications:  Medications Prior to Admission  Medication Sig Dispense Refill Last Dose  . amLODipine (NORVASC) 10 MG tablet Take 10 mg by mouth daily.   08/24/2019 at 0800  . aspirin 325 MG tablet Take 325 mg by mouth daily.   08/24/2019 at 0800  . atorvastatin (LIPITOR) 40 MG tablet Take 40 mg by mouth daily.   08/23/2019 at 2000  . gabapentin (NEURONTIN) 300 MG capsule Take 300 mg by mouth 2 (two) times daily.   08/24/2019 at 0800  . omega-3 acid ethyl esters (LOVAZA) 1 g capsule Take 1 g by mouth daily.   08/24/2019 at 0800  .  tiotropium (SPIRIVA) 18 MCG inhalation capsule Place 18 mcg into inhaler and inhale daily.   08/24/2019 at 0800  . warfarin (COUMADIN) 5 MG tablet Take 5 mg by mouth daily.   Not Taking at Unknown time   Scheduled:  . amLODipine  10 mg Oral Daily  . atorvastatin  40 mg Oral Daily  . gabapentin  300 mg Oral BID  . Melatonin  10 mg Oral QHS  . metoprolol tartrate  12.5 mg Oral BID  . omega-3 acid ethyl esters  1 g Oral Daily  . pantoprazole (PROTONIX) IV  40 mg Intravenous Q12H  . tiotropium  18 mcg Inhalation Daily  . warfarin  7.5 mg Oral ONCE-1800  . Warfarin - Pharmacist Dosing Inpatient   Does not apply q1800   Infusions:  . heparin     PRN: acetaminophen **OR** acetaminophen, ondansetron **OR** ondansetron (  ZOFRAN) IV Anti-infectives (From admission, onward)   None      Assessment: Pharmacy consulted to start heparin and warfarin. Home dose warfarin 5 mg daily. CBC stable. No bleeding noted. Gi was consulted. Pt was received vitamin K 10 mg on 12/4.    Date INR Warfarin Dose  12/5 1.2 7.5 mg         Goal of Therapy:  INR 2-3 Heparin level 0.3-0.7 units/ml Monitor platelets by anticoagulation protocol: Yes   Plan:  Heparin  12/7 1606 HL 1.05 supratherapeutic. Spoke with RN. Plan to hold heparin drip for approximately one hour then decrease rate. Heparin drip at 1300 units/hr to start at 1800. HL for 0000 in the morning.  Continue to monitor H&H and platelets D/c heparin once INR is > 2.   Warfarin INR is subtherapeutic. Will repeat higher dose of warfarin tonight (7.5 mg) to help overcome the vitamin K effects. Plan to reduce dose to home dose tomorrow (5 mg). Daily INR ordered.   Pricilla Riffle, PharmD 08/27/2019,4:50 PM

## 2019-08-27 NOTE — Consult Note (Signed)
ANTICOAGULATION CONSULT NOTE   Pharmacy Consult for Warfarin and Heparin Indication: Hx of DVT  Allergies  Allergen Reactions  . Hydrochlorothiazide     Other reaction(s): Unknown  . Hydrocodone Hives  . Latex Itching  . Penicillins Itching    "I sleep over the limit of time"    Patient Measurements: Height: 5\' 2"  (157.5 cm) Weight: 235 lb 5 oz (106.7 kg) IBW/kg (Calculated) : 50.1 Heparin Dosing Weight: 75.9 kg  Vital Signs: Temp: 98.3 F (36.8 C) (12/07 0525) Temp Source: Oral (12/07 0525) BP: 123/68 (12/07 0525) Pulse Rate: 66 (12/07 0525)  Labs: Recent Labs    08/24/19 1431  08/25/19 0821 08/26/19 0834 08/26/19 1418 08/26/19 2119 08/27/19 0548  HGB 10.3*  --  9.5* 9.9*  --   --  9.6*  HCT 32.2*  --  31.3* 30.5*  --   --  29.8*  PLT 271  --  273  --   --   --  287  APTT  --   --   --   --  31  --   --   LABPROT 45.9*   < > 17.4* 14.8  --   --  17.2*  INR 4.9*   < > 1.4* 1.2  --   --  1.4*  HEPARINUNFRC  --   --   --   --   --  0.17* 1.45*  CREATININE 0.67  --  0.77  --   --   --   --    < > = values in this interval not displayed.    Estimated Creatinine Clearance: 81.5 mL/min (by C-G formula based on SCr of 0.77 mg/dL).   Medical History: Past Medical History:  Diagnosis Date  . COPD (chronic obstructive pulmonary disease) (HCC)   . Depression   . Hypertension   . Peripheral vascular disease (HCC)   . Stroke [facility])     Medications:  Medications Prior to Admission  Medication Sig Dispense Refill Last Dose  . amLODipine (NORVASC) 10 MG tablet Take 10 mg by mouth daily.   08/24/2019 at 0800  . aspirin 325 MG tablet Take 325 mg by mouth daily.   08/24/2019 at 0800  . atorvastatin (LIPITOR) 40 MG tablet Take 40 mg by mouth daily.   08/23/2019 at 2000  . gabapentin (NEURONTIN) 300 MG capsule Take 300 mg by mouth 2 (two) times daily.   08/24/2019 at 0800  . omega-3 acid ethyl esters (LOVAZA) 1 g capsule Take 1 g by mouth daily.   08/24/2019 at 0800  .  tiotropium (SPIRIVA) 18 MCG inhalation capsule Place 18 mcg into inhaler and inhale daily.   08/24/2019 at 0800  . warfarin (COUMADIN) 5 MG tablet Take 5 mg by mouth daily.   Not Taking at Unknown time   Scheduled:  . amLODipine  10 mg Oral Daily  . atorvastatin  40 mg Oral Daily  . gabapentin  300 mg Oral BID  . Melatonin  10 mg Oral QHS  . metoprolol tartrate  12.5 mg Oral BID  . omega-3 acid ethyl esters  1 g Oral Daily  . pantoprazole (PROTONIX) IV  40 mg Intravenous Q12H  . tiotropium  18 mcg Inhalation Daily  . Warfarin - Pharmacist Dosing Inpatient   Does not apply q1800   Infusions:  . heparin 1,800 Units/hr (08/27/19 0646)   PRN: acetaminophen **OR** acetaminophen, ondansetron **OR** ondansetron (ZOFRAN) IV Anti-infectives (From admission, onward)   None      Assessment:  Pharmacy consulted to start heparin and warfarin. Home dose warfarin 5 mg daily. CBC stable. No bleeding noted. Gi was consulted. Pt was received vitamin K 10 mg on 12/4.   12/6 @ 2119 HL: 0.17. Level is subtherapeutic.   Date INR Warfarin Dose  12/5 1.2 7.5 mg         Goal of Therapy:  INR 2-3 Heparin level 0.3-0.7 units/ml Monitor platelets by anticoagulation protocol: Yes   Plan:  Heparin  12/7 @ 0548 HL: 1.45. Level is supratherapeutic.  Spoke with RN about holding heparin infusion.  Will place order for redraw of HL to confirm  Continue to monitor H&H and platelets D/c heparin once INR is > 2.   Warfarin INR is subtherapeutic. Will give a higher dose of warfarin tonight (7.5 mg) to help overcome the vitamin K effects. Plan to reduce dose to home dose tomorrow (5 mg). Daily INR ordered.   Pernell Dupre, PharmD, BCPS 08/27/2019,7:10 AM

## 2019-08-27 NOTE — Consult Note (Signed)
ANTICOAGULATION CONSULT NOTE   Pharmacy Consult for Warfarin and Heparin Indication: Hx of DVT  Allergies  Allergen Reactions  . Hydrochlorothiazide     Other reaction(s): Unknown  . Hydrocodone Hives  . Latex Itching  . Penicillins Itching    "I sleep over the limit of time"    Patient Measurements: Height: 5\' 2"  (157.5 cm) Weight: 235 lb 5 oz (106.7 kg) IBW/kg (Calculated) : 50.1 Heparin Dosing Weight: 75.9 kg  Vital Signs: Temp: 98.3 F (36.8 C) (12/07 0525) Temp Source: Oral (12/07 0525) BP: 123/68 (12/07 0525) Pulse Rate: 66 (12/07 0525)  Labs: Recent Labs    08/24/19 1431  08/25/19 0821 08/26/19 0834 08/26/19 1418 08/26/19 2119 08/27/19 0548 08/27/19 0741  HGB 10.3*  --  9.5* 9.9*  --   --  9.6*  --   HCT 32.2*  --  31.3* 30.5*  --   --  29.8*  --   PLT 271  --  273  --   --   --  287  --   APTT  --   --   --   --  31  --   --   --   LABPROT 45.9*   < > 17.4* 14.8  --   --  17.2*  --   INR 4.9*   < > 1.4* 1.2  --   --  1.4*  --   HEPARINUNFRC  --   --   --   --   --  0.17* 1.45* 1.59*  CREATININE 0.67  --  0.77  --   --   --   --   --    < > = values in this interval not displayed.    Estimated Creatinine Clearance: 81.5 mL/min (by C-G formula based on SCr of 0.77 mg/dL).   Medical History: Past Medical History:  Diagnosis Date  . COPD (chronic obstructive pulmonary disease) (HCC)   . Depression   . Hypertension   . Peripheral vascular disease (HCC)   . Stroke [facility])     Medications:  Medications Prior to Admission  Medication Sig Dispense Refill Last Dose  . amLODipine (NORVASC) 10 MG tablet Take 10 mg by mouth daily.   08/24/2019 at 0800  . aspirin 325 MG tablet Take 325 mg by mouth daily.   08/24/2019 at 0800  . atorvastatin (LIPITOR) 40 MG tablet Take 40 mg by mouth daily.   08/23/2019 at 2000  . gabapentin (NEURONTIN) 300 MG capsule Take 300 mg by mouth 2 (two) times daily.   08/24/2019 at 0800  . omega-3 acid ethyl esters (LOVAZA) 1 g  capsule Take 1 g by mouth daily.   08/24/2019 at 0800  . tiotropium (SPIRIVA) 18 MCG inhalation capsule Place 18 mcg into inhaler and inhale daily.   08/24/2019 at 0800  . warfarin (COUMADIN) 5 MG tablet Take 5 mg by mouth daily.   Not Taking at Unknown time   Scheduled:  . amLODipine  10 mg Oral Daily  . atorvastatin  40 mg Oral Daily  . gabapentin  300 mg Oral BID  . Melatonin  10 mg Oral QHS  . metoprolol tartrate  12.5 mg Oral BID  . omega-3 acid ethyl esters  1 g Oral Daily  . pantoprazole (PROTONIX) IV  40 mg Intravenous Q12H  . tiotropium  18 mcg Inhalation Daily  . warfarin  7.5 mg Oral ONCE-1800  . Warfarin - Pharmacist Dosing Inpatient   Does not apply [phone]  Infusions:  . heparin 1,500 Units/hr (08/27/19 0934)   PRN: acetaminophen **OR** acetaminophen, ondansetron **OR** ondansetron (ZOFRAN) IV Anti-infectives (From admission, onward)   None      Assessment: Pharmacy consulted to start heparin and warfarin. Home dose warfarin 5 mg daily. CBC stable. No bleeding noted. Gi was consulted. Pt was received vitamin K 10 mg on 12/4.   12/6 @ 2119 HL: 0.17. Level is subtherapeutic.   Date INR Warfarin Dose  12/5 1.2 7.5 mg         Goal of Therapy:  INR 2-3 Heparin level 0.3-0.7 units/ml Monitor platelets by anticoagulation protocol: Yes   Plan:  Heparin  12/7 @ 0548 HL: 1.45. Repeat lab@0741 : HL 1.59. Level is supratherapeutic.  Spoke with RN to hold heparin for approximately 2 hours(stopped at roughly 0730 this AM) Will restart infusion at 1500 units/hr and recheck HL in 6 hours.  Continue to monitor H&H and platelets D/c heparin once INR is > 2.   Warfarin INR is subtherapeutic. Will repeat higher dose of warfarin tonight (7.5 mg) to help overcome the vitamin K effects. Plan to reduce dose to home dose tomorrow (5 mg). Daily INR ordered.   Pearla Dubonnet, PharmD 08/27/2019,9:55 AM

## 2019-08-28 DIAGNOSIS — K921 Melena: Secondary | ICD-10-CM | POA: Diagnosis not present

## 2019-08-28 LAB — CBC
HCT: 29.4 % — ABNORMAL LOW (ref 36.0–46.0)
Hemoglobin: 9.3 g/dL — ABNORMAL LOW (ref 12.0–15.0)
MCH: 26.1 pg (ref 26.0–34.0)
MCHC: 31.6 g/dL (ref 30.0–36.0)
MCV: 82.4 fL (ref 80.0–100.0)
Platelets: 291 10*3/uL (ref 150–400)
RBC: 3.57 MIL/uL — ABNORMAL LOW (ref 3.87–5.11)
RDW: 15.5 % (ref 11.5–15.5)
WBC: 9.7 10*3/uL (ref 4.0–10.5)
nRBC: 0 % (ref 0.0–0.2)

## 2019-08-28 LAB — HEPARIN LEVEL (UNFRACTIONATED)
Heparin Unfractionated: 0.65 IU/mL (ref 0.30–0.70)
Heparin Unfractionated: 0.99 IU/mL — ABNORMAL HIGH (ref 0.30–0.70)

## 2019-08-28 LAB — PROTIME-INR
INR: 1.9 — ABNORMAL HIGH (ref 0.8–1.2)
Prothrombin Time: 21.5 seconds — ABNORMAL HIGH (ref 11.4–15.2)

## 2019-08-28 MED ORDER — WARFARIN SODIUM 5 MG PO TABS
5.0000 mg | ORAL_TABLET | Freq: Once | ORAL | Status: AC
  Administered 2019-08-28: 11:00:00 5 mg via ORAL
  Filled 2019-08-28: qty 1

## 2019-08-28 NOTE — Progress Notes (Signed)
[name] to be D/C'd Home per MD order.  Discussed prescriptions and follow up appointments with the patient. Prescriptions given to patient, medication list explained in detail. Pt verbalized understanding.  Allergies as of 08/28/2019      Reactions   Hydrochlorothiazide    Other reaction(s): Unknown   Hydrocodone Hives   Latex Itching   Penicillins Itching   "I sleep over the limit of time"      Medication List    STOP taking these medications   aspirin 325 MG tablet     TAKE these medications   acetaminophen 325 MG tablet Commonly known as: TYLENOL Take 2 tablets (650 mg total) by mouth every 6 (six) hours as needed for mild pain, moderate pain or headache (or Fever >/= 101).   amLODipine 10 MG tablet Commonly known as: NORVASC Take 10 mg by mouth daily.   atorvastatin 40 MG tablet Commonly known as: LIPITOR Take 40 mg by mouth daily.   gabapentin 300 MG capsule Commonly known as: NEURONTIN Take 300 mg by mouth 2 (two) times daily.   omega-3 acid ethyl esters 1 g capsule Commonly known as: LOVAZA Take 1 g by mouth daily.   tiotropium 18 MCG inhalation capsule Commonly known as: SPIRIVA Place 18 mcg into inhaler and inhale daily.   warfarin 5 MG tablet Commonly known as: COUMADIN Take 5 mg by mouth daily.       Vitals:   08/27/19 2001 08/28/19 0506  BP: 121/82 114/68  Pulse: 72 60  Resp: 16 20  Temp: 98.2 F (36.8 C) (!) 97.5 F (36.4 C)  SpO2: 96% 94%    Skin clean, dry and intact without evidence of skin break down, no evidence of skin tears noted. IV catheter discontinued intact. Site without signs and symptoms of complications. Dressing and pressure applied. Pt denies pain at this time. No complaints noted.  An After Visit Summary was printed and given to the patient. Patient escorted via [place], and D/C home via private auto.  Fuller Mandril, RN

## 2019-08-28 NOTE — Discharge Instructions (Signed)
Gastrointestinal Bleeding Gastrointestinal (GI) bleeding is bleeding somewhere along the path that food travels through the body (digestive tract). This path is anywhere between the mouth and the opening of the butt (anus). You may have blood in your poop (stool) or have black poop. If you throw up (vomit), there may be blood in it. This condition can be mild, serious, or even life-threatening. If you have a lot of bleeding, you may need to stay in the hospital. What are the causes? This condition may be caused by:  Irritation and swelling of the esophagus (esophagitis). The esophagus is part of the body that moves food from your mouth to your stomach.  Swollen veins in the butt (hemorrhoids).  Areas of painful tearing in the opening of the butt (anal fissures). These are often caused by passing hard poop.  Pouches that form on the colon over time (diverticulosis).  Irritation and swelling (diverticulitis) in areas where pouches have formed on the colon.  Growths (polyps) or cancer. Colon cancer often starts out as growths that are not cancer.  Irritation of the stomach lining (gastritis).  Sores (ulcers) in the stomach. What increases the risk? You are more likely to develop this condition if you:  Have a certain type of infection in your stomach (Helicobacter pylori infection).  Take certain medicines.  Smoke.  Drink alcohol. What are the signs or symptoms? Common symptoms of this condition include:  Throwing up (vomiting) material that has bright red blood in it. It may look like coffee grounds.  Changes in your poop. The poop may: ? Have red blood in it. ? Be black, look like tar, and smell stronger than normal. ? Be red.  Pain or cramping in the belly (abdomen). How is this treated? Treatment for this condition depends on the cause of the bleeding. For example:  Sometimes, the bleeding can be stopped during a procedure that is done to find the problem (endoscopy or  colonoscopy).  Medicines can be used to: ? Help control irritation, swelling, or infection. ? Reduce acid in your stomach.  Certain problems can be treated with: ? Creams. ? Medicines that are put in the butt (suppositories). ? Warm baths.  Surgery is sometimes needed.  If you lose a lot of blood, you may need a blood transfusion. If bleeding is mild, you may be allowed to go home. If there is a lot of bleeding, you will need to stay in the hospital. Follow these instructions at home:   Take over-the-counter and prescription medicines only as told by your doctor.  Eat foods that have a lot of fiber in them. These foods include beans, whole grains, and fresh fruits and vegetables. You can also try eating 1-3 prunes each day.  Drink enough fluid to keep your pee (urine) pale yellow.  Keep all follow-up visits as told by your doctor. This is important. Contact a doctor if:  Your symptoms do not get better. Get help right away if:  Your bleeding does not stop.  You feel dizzy or you pass out (faint).  You feel weak.  You have very bad cramps in your back or belly.  You pass large clumps of blood (clots) in your poop.  Your symptoms are getting worse.  You have chest pain or fast heartbeats. Summary  GI bleeding is bleeding somewhere along the path that food travels through the body (digestive tract).  This bleeding can be caused by many things. Treatment depends on the cause of the bleeding.  Take medicines only as told by your doctor.  Keep all follow-up visits as told by your doctor. This is important. This information is not intended to replace advice given to you by your health care provider. Make sure you discuss any questions you have with your health care provider. Document Released: 06/15/2008 Document Revised: 04/19/2018 Document Reviewed: 04/19/2018 Elsevier Patient Education  2020 [employer] Bleeding Precautions When on Anticoagulant Therapy,  Adult Anticoagulant therapy, also called blood thinner therapy, is medicine that helps to prevent and treat blood clots. The medicine works by stopping blood clots from forming or growing. Blood clots that form in your blood vessels can be dangerous. They can break loose and travel to the heart, lungs, or brain. This increases the risk of a heart attack, stroke, or blocked lung artery (pulmonary embolism). Anticoagulants also increase the risk of bleeding. Try to protect yourself from cuts and other injuries that can cause bleeding. It is important to take anticoagulants exactly as told by your health care provider. Why do I need to be on anticoagulant therapy? You may need this medicine if you are at risk of developing a blood clot. Conditions that increase your risk of a blood clot include:  Being born with heart disease or a heart malformation (congenital heart disease).  Developing heart disease.  Having had surgery, such as valve replacement.  Having had a serious accident or other type of severe injury (trauma).  Having certain types of cancer.  Having certain diseases that can increase blood clotting.  Having a high risk of stroke or heart attack.  Having atrial fibrillation (AF). What are the common anticoagulant medicines? There are several types of anticoagulant medicines. The most common types are:  Medicines that you take by mouth (oral medicines), such as: ? Warfarin. ? Novel oral anticoagulants (NOACs), such as: ? Direct thrombin inhibitors (dabigatran). ? Factor Xa inhibitors (apixaban, edoxaban, and rivaroxaban).  Injections, such as: ? Unfractionated heparin. ? Low molecular weight heparin. These anticoagulants work in different ways to prevent blood clots. They also have different risks and side effects. What do I need to remember while on anticoagulant therapy? Taking anticoagulants  Take your medicine at the same time every day. If you forget to take your  medicine, take it as soon as you remember. Do not double your dosage of medicine if you miss a whole day. Take your normal dose and call your health care provider.  Do not stop taking your medicine unless your health care provider approves. Stopping the medicine can increase your risk of developing a blood clot. Taking other medicines  Take over-the-counter and prescriptions medicines only as told by your health care provider.  Do not take over-the-counter NSAIDs, including aspirin and ibuprofen, while you are on anticoagulant therapy. These medicines increase your risk of dangerous bleeding.  Get approval from your health care provider before you start taking any new medicines, vitamins, or herbal products. Some of these could interfere with your therapy. General instructions  Keep all follow-up visits as told by your health care provider. This is important.  If you are pregnant or trying to get pregnant, talk with a health care provider about anticoagulants. Some of these medicines are not safe to take during pregnancy.  Tell all health care providers, including your dentist, that you are on anticoagulant therapy. It is especially important to tell providers before you have any surgery, medical procedures, or dental work done. What precautions should I take?   Be very careful when  using knives, scissors, or other sharp objects.  Use an electric razor instead of a blade.  Do not use toothpicks.  Use a soft-bristled toothbrush. Brush your teeth gently.  Always wear shoes outdoors and wear slippers indoors.  Be careful when cutting your fingernails and toenails.  Place bath mats in the bathroom. If possible, install handrails as well.  Wear gloves while you do yard work.  Wear your seat belt.  Prevent falls by removing loose rugs and extension cords from areas where you walk. Use a cane or walker if you need it.  Avoid constipation by: ? Drinking enough fluid to keep your  urine clear or pale yellow. ? Eating foods that are high in fiber, such as fresh fruits and vegetables, whole grains, and beans. ? Limiting foods that are high in fat and processed sugars, such as fried and sweet foods.  Do not play contact sports or participate in other activities that have a high risk for injury. What other precautions are important if on warfarin therapy? If you are taking a type of anticoagulant called warfarin, make sure you:  Work with a diet and nutrition specialist (dietitian) to make an eating plan. Do not make any sudden changes to your diet after you have started your eating plan.  Do not drink alcohol. It can interfere with your medicine and increase your risk of an injury that causes bleeding.  Get regular blood tests as told by your health care provider. What are some questions to ask my health care provider?  Why do I need anticoagulant therapy?  What is the best anticoagulant therapy for my condition?  How long will I need anticoagulant therapy?  What are the side effects of anticoagulant therapy?  When should I take my medicine? What should I do if I forget to take it?  Will I need to have regular blood tests?  Do I need to change my diet? Are there foods or drinks that I should avoid?  What activities are safe for me?  What should I do if I want to get pregnant? Contact a health care provider if:  You miss a dose of medicine: ? And you are not sure what to do. ? For more than one day.  You have: ? Menstrual bleeding that is heavier than normal. ? Bloody or brown urine. ? Easy bruising. ? Black and tarry stool or bright red stool. ? Side effects from your medicine.  You feel weak or dizzy.  You become pregnant. Get help right away if:  You have bleeding that will not stop within 20 minutes from: ? The nose. ? The gums. ? A cut on the skin.  You have a severe headache or stomachache.  You vomit or cough up blood.  You fall or  hit your head. Summary  Anticoagulant therapy, also called blood thinner therapy, is medicine that helps to prevent and treat blood clots.  Anticoagulants work in different ways to prevent blood clots. They also have different risks and side effects.  Talk with your health care provider about any precautions that you should take while on anticoagulant therapy. This information is not intended to replace advice given to you by your health care provider. Make sure you discuss any questions you have with your health care provider. Document Released: 08/18/2015 Document Revised: 12/27/2018 Document Reviewed: 11/23/2016 Elsevier Patient Education  [place].

## 2019-08-28 NOTE — Care Management Important Message (Signed)
Important Message  Patient Details  Name: Tammy Wagner MRN: [id number] Date of Birth: 10/18/1955   Medicare Important Message Given:  Yes - Important Message mailed due to current National Emergency  Spoke with patient via phone.  Aware of right.  Requested copy of Medicare IM be mailed.  Confirmed address.    Dannette Barbara 08/28/2019, 11:15 AM

## 2019-08-28 NOTE — Discharge Summary (Signed)
Physician Discharge Summary  Patient ID: Tammy Wagner MRN: [id number] DOB/AGE: 12-29-1955 63 y.o.  Admit date: 08/24/2019 Discharge date: 08/28/2019  Admission Diagnoses:  Discharge Diagnoses:  Principal Problem:   GI bleed Active Problems:   COPD (chronic obstructive pulmonary disease) (HCC)   History of stroke   Essential hypertension   Obesity, Class III, BMI 40-49.9 (morbid obesity) (HCC)   PVD (peripheral vascular disease) (HCC)   Acute blood loss anemia   Melena   GIB (gastrointestinal bleeding)   Hematemesis with nausea   Acute gastric erosion   Discharged Condition: fair  Hospital Course:  Brief Narrative:  Tammy Wagner a 63 y.o.Fwith hx CVA, cryptogenic on warfarin, residual mild left hemiparesis, HTN,obesityand PVDwho presents with melena for 1 day. Patient was in usual state of health (visiting family [place]) until yesterday when she "coughed up blood" several times. Then today, she had a black tarry bowel movement, and noticed blood when wiping after urinating, and so came to ER. She has not been taking NSAIDs. She has been eating greens over Thanksgiving and hasn't had INR checked in a while. In the ER, she was afebrile, HR and BP normal. RR and SpO2 normal. Hgb 10.3 g/dL which is stable from baseline (8-10 g/dL per CareEverywhere). INR 4.9. Gross melena on exam. No stools or bleeding in ER. She was given 10 mg IV vitamin K and protonic 40 mg IV and the hospitliast service were asked to evaluate. Of note, she has had fever, aches, malasie and has 3 household members with new COVID diagnoses.  GI bleed Acute blood loss anemia Supratherapeutic INR --Status post EGD.  That shows some hiatal hernia.  No active site of bleeding could not be a could be identified. - No obvious GI bleeding since admission - Hgb remained above 9 following admission - Hemodynamically stable.  - INR on discharge came up to 1.9.  Pharmacy maintain his Coumadin while  bridging with heparin.  Is to go home and continue her home Coumadin dose as prescribed.  Patient was advised strongly to follow-up with her PCP in 1 to 2 days to recheck INR and dose adjustment of Coumadin if deemed necessary by PCP.  Myalgias: Resolved - no signs of symptoms of upper respiratory tract infection at this point - May check other viral panel  Hx of recent COVID exposures from family members who she lives with -Repeat SARS-CoV-2 PCR --> negative from this admission -Remain on precaution -Check CXR --WNL   COPD No active spasm -Resumed tiotropium med  History CVA Hypertension Peripheral vasculvar disease - No acute issues  -Resumed antihypertensives, cholesterol medications when med rec complete -Resume Coumadin  Other medications -Continuegabapentin  Covid-19 precautions: Patient's family members who she was living with prior to being admitted here recently diagnosed with COVID-19 infection.  Even though patient tested negative since admission CDC guidelines on hospital guidelines Covid precautions  being implemented. She has no acute issues or complaints -To follow precaution  Disposition: And was supposed to be discharged on 08/27/2019 home with close follow-up with PCP for INR recheck as she is still subtherapeutic.  However due to patient being in isolation and from another city it was difficult to schedule an appointment with her PCP for the case manager social worker and nurses.  -Has had INR is close to her target range she is to be discharged with close follow-up with her PCP  Consults: GI  Significant Diagnostic Studies: EGD and other diagnostic studies  Treatments: As per hospital course  and discharge med list  Discharge Exam: Blood pressure 114/68, pulse 60, temperature (!) 97.5 F (36.4 C), temperature source Oral, resp. rate 20, height 5\' 2"  (1.575 m), weight 106.7 kg, SpO2 94 %.  General appearance:Well-developed, obeseadultfemale, alert  and inno acutedistress.  Eyes:Anicteric, conjunctiva pink, lids and lashes normal. PERRL.  Neck:No neck masses. Trachea midline. No thyromegaly/tenderness. Lymph:No cervical or supraclavicular lymphadenopathy. Skin:Warm and dry. No jaundice. No suspicious rashes or lesions. Cardiac:RRR, nl S1-S2, no murmurs appreciated. Capillary refill is brisk. JVP not visible. No LE edema. Radial pulses 2+ and symmetric. Respiratory:Normal respiratory rate and rhythm. CTAB without rales or wheezes. Abdomen:Abdomen soft. No TTP or guarding. No ascites, distension, hepatosplenomegaly.  MSK:No deformities or effusions of the large joints of the upper or lower extremities bilaterally. No cyanosis or clubbing. Neuro:Cranial nerves normal. Sensation intact to light touch. Speech is fluent. Muscle strength normal.  Psych: attention normal. Behavior appropriate. Affect normal. Judgment and insight appear normal.  Disposition: Discharge disposition: 01-Home or Self Care     Signs and symptoms explained to patient when she must seek immediate medical attention.  Patient expressed understanding.  Discharge Instructions    Call MD for:  difficulty breathing, headache or visual disturbances   Complete by: As directed    Diet - low sodium heart healthy   Complete by: As directed    Increase activity slowly   Complete by: As directed      Allergies as of 08/28/2019      Reactions   Hydrochlorothiazide    Other reaction(s): Unknown   Hydrocodone Hives   Latex Itching   Penicillins Itching   "I sleep over the limit of time"      Medication List    STOP taking these medications   aspirin 325 MG tablet     TAKE these medications   acetaminophen 325 MG tablet Commonly known as: TYLENOL Take 2 tablets (650 mg total) by mouth every 6 (six) hours as needed for mild pain, moderate pain or headache (or Fever >/= 101).   amLODipine 10 MG tablet Commonly known as:  NORVASC Take 10 mg by mouth daily.   atorvastatin 40 MG tablet Commonly known as: LIPITOR Take 40 mg by mouth daily.   gabapentin 300 MG capsule Commonly known as: NEURONTIN Take 300 mg by mouth 2 (two) times daily.   omega-3 acid ethyl esters 1 g capsule Commonly known as: LOVAZA Take 1 g by mouth daily.   tiotropium 18 MCG inhalation capsule Commonly known as: SPIRIVA Place 18 mcg into inhaler and inhale daily.   warfarin 5 MG tablet Commonly known as: COUMADIN Take 5 mg by mouth daily.       Signed: Thornell Mule 08/28/2019, 1:39 PM

## 2019-08-28 NOTE — Consult Note (Signed)
ANTICOAGULATION CONSULT NOTE   Pharmacy Consult for Warfarin and Heparin Indication: Hx of DVT  Allergies  Allergen Reactions  . Hydrochlorothiazide     Other reaction(s): Unknown  . Hydrocodone Hives  . Latex Itching  . Penicillins Itching    "I sleep over the limit of time"   Patient Measurements: Height: 5\' 2"  (157.5 cm) Weight: 235 lb 5 oz (106.7 kg) IBW/kg (Calculated) : 50.1 Heparin Dosing Weight: 75.9 kg  Vital Signs: Temp: 98.2 F (36.8 C) (12/07 2001) Temp Source: Oral (12/07 2001) BP: 121/82 (12/07 2001) Pulse Rate: 72 (12/07 2001)  Labs: Recent Labs    08/25/19 0821 08/26/19 0834 08/26/19 1418  08/27/19 0548 08/27/19 0741 08/27/19 1606 08/28/19 0018  HGB 9.5* 9.9*  --   --  9.6*  --   --   --   HCT 31.3* 30.5*  --   --  29.8*  --   --   --   PLT 273  --   --   --  287  --   --   --   APTT  --   --  31  --   --   --   --   --   LABPROT 17.4* 14.8  --   --  17.2*  --   --   --   INR 1.4* 1.2  --   --  1.4*  --   --   --   HEPARINUNFRC  --   --   --    < > 1.45* 1.59* 1.05* 0.65  CREATININE 0.77  --   --   --   --   --   --   --    < > = values in this interval not displayed.    Estimated Creatinine Clearance: 81.5 mL/min (by C-G formula based on SCr of 0.77 mg/dL).  Medical History: Past Medical History:  Diagnosis Date  . COPD (chronic obstructive pulmonary disease) (HCC)   . Depression   . Hypertension   . Peripheral vascular disease (HCC)   . Stroke [facility])     Medications:  Medications Prior to Admission  Medication Sig Dispense Refill Last Dose  . amLODipine (NORVASC) 10 MG tablet Take 10 mg by mouth daily.   08/24/2019 at 0800  . aspirin 325 MG tablet Take 325 mg by mouth daily.   08/24/2019 at 0800  . atorvastatin (LIPITOR) 40 MG tablet Take 40 mg by mouth daily.   08/23/2019 at 2000  . gabapentin (NEURONTIN) 300 MG capsule Take 300 mg by mouth 2 (two) times daily.   08/24/2019 at 0800  . omega-3 acid ethyl esters (LOVAZA) 1 g capsule Take  1 g by mouth daily.   08/24/2019 at 0800  . tiotropium (SPIRIVA) 18 MCG inhalation capsule Place 18 mcg into inhaler and inhale daily.   08/24/2019 at 0800  . warfarin (COUMADIN) 5 MG tablet Take 5 mg by mouth daily.   Not Taking at Unknown time   Scheduled:  . amLODipine  10 mg Oral Daily  . atorvastatin  40 mg Oral Daily  . gabapentin  300 mg Oral BID  . Melatonin  10 mg Oral QHS  . metoprolol tartrate  12.5 mg Oral BID  . omega-3 acid ethyl esters  1 g Oral Daily  . pantoprazole (PROTONIX) IV  40 mg Intravenous Q12H  . tiotropium  18 mcg Inhalation Daily  . Warfarin - Pharmacist Dosing Inpatient   Does not apply q1800   Infusions:  .  heparin 1,300 Units/hr (08/27/19 1828)   PRN: acetaminophen **OR** acetaminophen, ondansetron **OR** ondansetron (ZOFRAN) IV Anti-infectives (From admission, onward)   None      Assessment: Pharmacy consulted to start heparin and warfarin. Home dose warfarin 5 mg daily. CBC stable. No bleeding noted. Gi was consulted. Pt was received vitamin K 10 mg on 12/4.    Date INR Warfarin Dose  12/5 1.2 7.5 mg         Goal of Therapy:  INR 2-3 Heparin level 0.3-0.7 units/ml Monitor platelets by anticoagulation protocol: Yes   Plan:  Heparin  12/7 1606 HL 1.05 supratherapeutic. Spoke with RN. Plan to hold heparin drip for approximately one hour then decrease rate. Heparin drip at 1300 units/hr to start at 1800. HL for 0000 in the morning.  12/8 @ 0018 HL = 0.65, therapeutic.  Continue Heparin drip at 1300 units/hr.  Recheck HL at 0630  Continue to monitor H&H and platelets D/c heparin once INR is > 2.   Warfarin INR is subtherapeutic. Will repeat higher dose of warfarin tonight (7.5 mg) to help overcome the vitamin K effects. Plan to reduce dose to home dose tomorrow (5 mg). Daily INR ordered.   Ena Dawley, PharmD 08/28/2019,1:31 AM

## 2019-08-28 NOTE — Consult Note (Signed)
[facility] for Warfarin and Heparin Indication: Hx of DVT  Allergies  Allergen Reactions  . Hydrochlorothiazide     Other reaction(s): Unknown  . Hydrocodone Hives  . Latex Itching  . Penicillins Itching    "I sleep over the limit of time"   Patient Measurements: Height: 5\' 2"  (157.5 cm) Weight: 235 lb 5 oz (106.7 kg) IBW/kg (Calculated) : 50.1 Heparin Dosing Weight: 75.9 kg  Vital Signs: Temp: 97.5 F (36.4 C) (12/08 0506) Temp Source: Oral (12/08 0506) BP: 114/68 (12/08 0506) Pulse Rate: 60 (12/08 0506)  Labs: Recent Labs    08/26/19 0834 08/26/19 1418  08/27/19 0548  08/27/19 1606 08/28/19 0018 08/28/19 0651  HGB 9.9*  --   --  9.6*  --   --   --  9.3*  HCT 30.5*  --   --  29.8*  --   --   --  29.4*  PLT  --   --   --  287  --   --   --  291  APTT  --  31  --   --   --   --   --   --   LABPROT 14.8  --   --  17.2*  --   --   --  21.5*  INR 1.2  --   --  1.4*  --   --   --  1.9*  HEPARINUNFRC  --   --    < > 1.45*   < > 1.05* 0.65 0.99*   < > = values in this interval not displayed.    Estimated Creatinine Clearance: 81.5 mL/min (by C-G formula based on SCr of 0.77 mg/dL).  Medical History: Past Medical History:  Diagnosis Date  . COPD (chronic obstructive pulmonary disease) ([place])   . Depression   . Hypertension   . Peripheral vascular disease ([place])   . Stroke [facility])     Medications:  Medications Prior to Admission  Medication Sig Dispense Refill Last Dose  . amLODipine (NORVASC) 10 MG tablet Take 10 mg by mouth daily.   08/24/2019 at 0800  . aspirin 325 MG tablet Take 325 mg by mouth daily.   08/24/2019 at 0800  . atorvastatin (LIPITOR) 40 MG tablet Take 40 mg by mouth daily.   08/23/2019 at 2000  . gabapentin (NEURONTIN) 300 MG capsule Take 300 mg by mouth 2 (two) times daily.   08/24/2019 at 0800  . omega-3 acid ethyl esters (LOVAZA) 1 g capsule Take 1 g by mouth daily.   08/24/2019 at 0800  . tiotropium (SPIRIVA)  18 MCG inhalation capsule Place 18 mcg into inhaler and inhale daily.   08/24/2019 at 0800  . warfarin (COUMADIN) 5 MG tablet Take 5 mg by mouth daily.   Not Taking at Unknown time   Scheduled:  . amLODipine  10 mg Oral Daily  . atorvastatin  40 mg Oral Daily  . gabapentin  300 mg Oral BID  . Melatonin  10 mg Oral QHS  . metoprolol tartrate  12.5 mg Oral BID  . omega-3 acid ethyl esters  1 g Oral Daily  . pantoprazole (PROTONIX) IV  40 mg Intravenous Q12H  . tiotropium  18 mcg Inhalation Daily  . warfarin  5 mg Oral Once  . Warfarin - Pharmacist Dosing Inpatient   Does not apply q1800   Infusions:   PRN: acetaminophen **OR** acetaminophen, ondansetron **OR** ondansetron (ZOFRAN) IV Anti-infectives (From admission, onward)  None      Assessment: Pharmacy consulted to start heparin and warfarin. Home dose warfarin 5 mg daily. CBC stable. No bleeding noted. Gi was consulted. Pt was received vitamin K 10 mg on 12/4.    Date INR Warfarin Dose  12/5 1.2 7.5 mg   12/6 1.2 7.5 mg  12/7 1.4 7.5 mg  12/8 1.9         Goal of Therapy:  INR 2-3 Heparin level 0.3-0.7 units/ml Monitor platelets by anticoagulation protocol: Yes   Plan:  Heparin  Per hospitalist, will discontinue heparin drip as patient's INR is trending up towards therapeutic range and will continue with warfarin dosing.  Warfarin INR is slightly subtherapeutic. Will reduce dose to home dose today of warfarin 5mg . Daily INR ordered.   , PharmD 08/28/2019,10:09 AM

## 2021-03-24 IMAGING — DX DG CHEST 1V PORT
1 series · 1 of 1 positions shown · non-contrast
Comparison: None.

CLINICAL DATA: Cough short of breath

EXAM:
PORTABLE CHEST 1 VIEW

[chest ap]
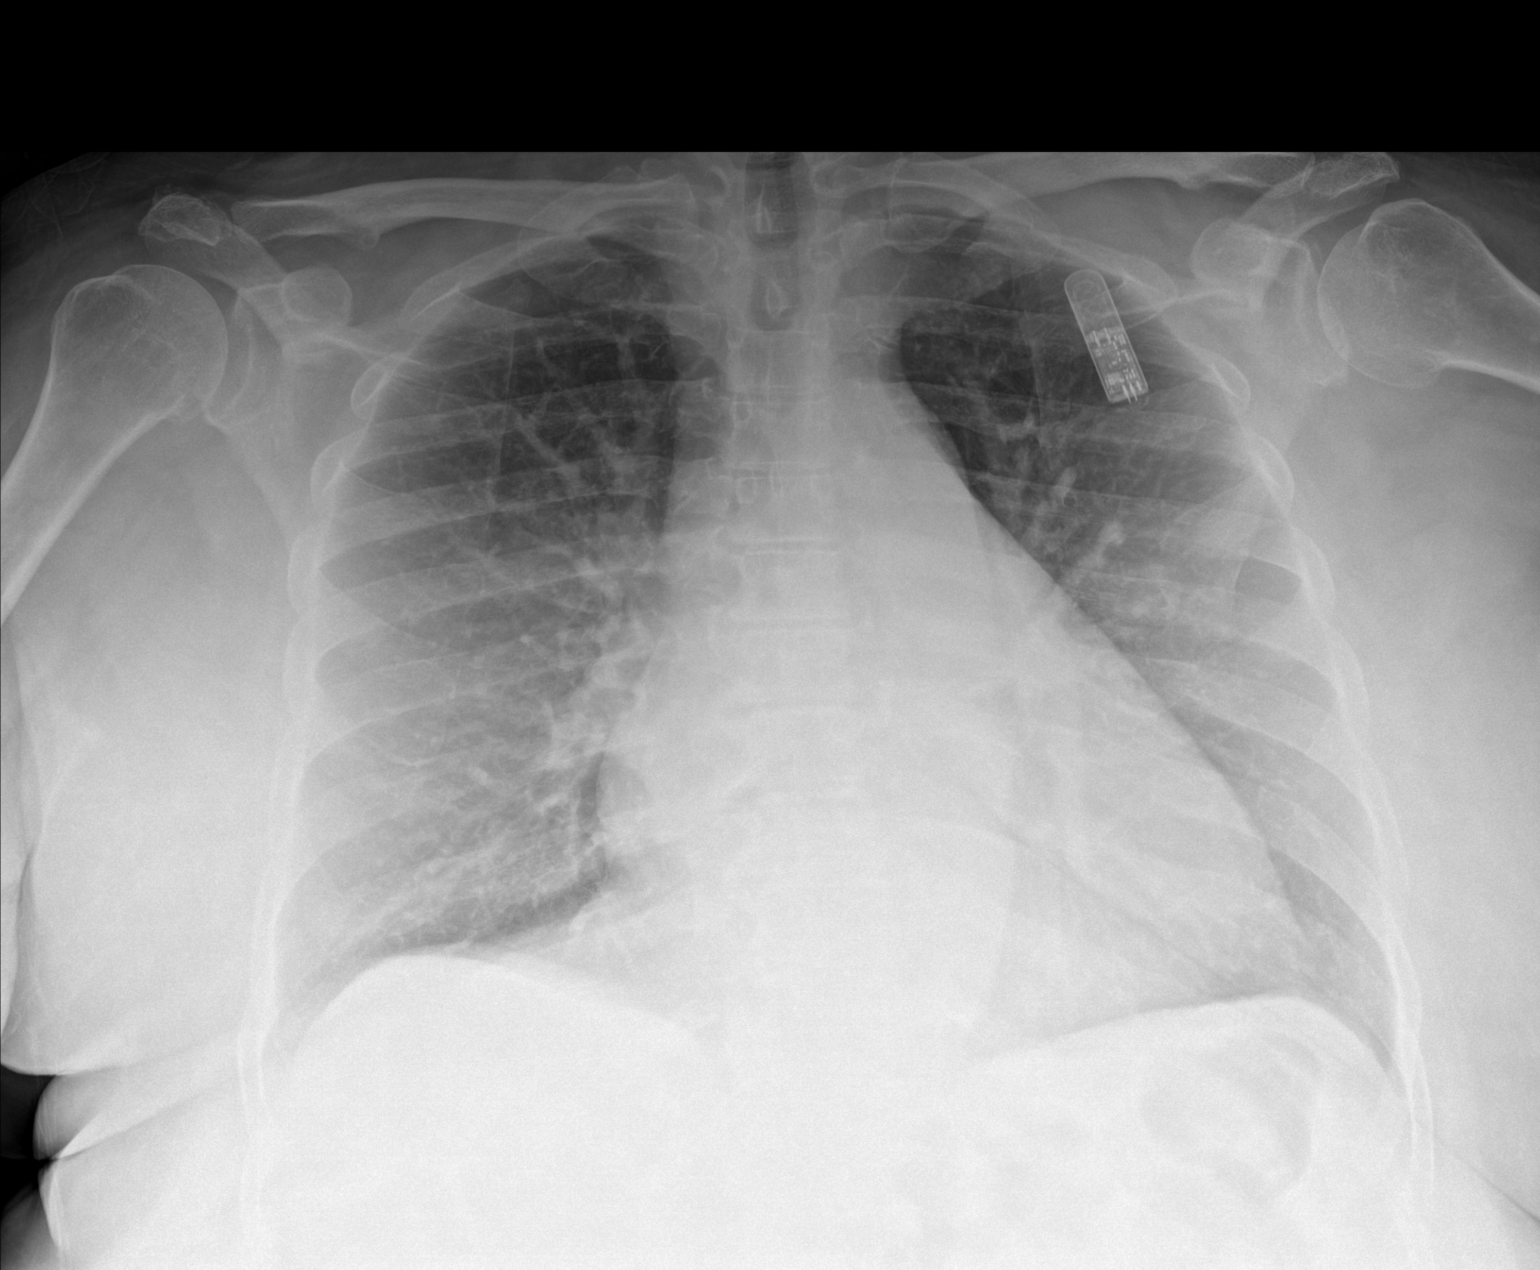

[1 of 1 positions shown; findings below may reference images not displayed]

FINDINGS: Electronic device over the left upper chest. Cardiomegaly. No focal
airspace disease or effusion. No pneumothorax.
IMPRESSION: No active disease.  Cardiomegaly
